# Patient Record
Sex: Female | Born: 1955
Health system: Southern US, Community
[De-identification: ages and names within clinical notes are randomized; demographics above are authoritative.]

## PROBLEM LIST (undated history)

## (undated) DIAGNOSIS — B009 Herpesviral infection, unspecified: Secondary | ICD-10-CM

## (undated) DIAGNOSIS — M1990 Unspecified osteoarthritis, unspecified site: Secondary | ICD-10-CM

## (undated) DIAGNOSIS — K219 Gastro-esophageal reflux disease without esophagitis: Secondary | ICD-10-CM

## (undated) DIAGNOSIS — M8580 Other specified disorders of bone density and structure, unspecified site: Secondary | ICD-10-CM

## (undated) DIAGNOSIS — K8020 Calculus of gallbladder without cholecystitis without obstruction: Secondary | ICD-10-CM

## (undated) DIAGNOSIS — I10 Essential (primary) hypertension: Secondary | ICD-10-CM

## (undated) DIAGNOSIS — A6000 Herpesviral infection of urogenital system, unspecified: Secondary | ICD-10-CM

## (undated) DIAGNOSIS — N840 Polyp of corpus uteri: Secondary | ICD-10-CM

## (undated) DIAGNOSIS — IMO0002 Reserved for concepts with insufficient information to code with codable children: Secondary | ICD-10-CM

## (undated) DIAGNOSIS — R945 Abnormal results of liver function studies: Secondary | ICD-10-CM

## (undated) DIAGNOSIS — R7989 Other specified abnormal findings of blood chemistry: Secondary | ICD-10-CM

## (undated) DIAGNOSIS — I341 Nonrheumatic mitral (valve) prolapse: Secondary | ICD-10-CM

## (undated) DIAGNOSIS — R87619 Unspecified abnormal cytological findings in specimens from cervix uteri: Secondary | ICD-10-CM

## (undated) HISTORY — DX: Other specified disorders of bone density and structure, unspecified site: M85.80

## (undated) HISTORY — DX: Unspecified osteoarthritis, unspecified site: M19.90

## (undated) HISTORY — DX: Gastro-esophageal reflux disease without esophagitis: K21.9

## (undated) HISTORY — DX: Abnormal results of liver function studies: R94.5

## (undated) HISTORY — DX: Herpesviral infection of urogenital system, unspecified: A60.00

## (undated) HISTORY — DX: Calculus of gallbladder without cholecystitis without obstruction: K80.20

## (undated) HISTORY — PX: DILATION AND CURETTAGE OF UTERUS: SHX78

## (undated) HISTORY — PX: NASAL SEPTUM SURGERY: SHX37

## (undated) HISTORY — PX: TONSILLECTOMY: SUR1361

## (undated) HISTORY — DX: Polyp of corpus uteri: N84.0

## (undated) HISTORY — DX: Unspecified abnormal cytological findings in specimens from cervix uteri: R87.619

## (undated) HISTORY — PX: HYSTEROSCOPY: SHX211

## (undated) HISTORY — DX: Nonrheumatic mitral (valve) prolapse: I34.1

## (undated) HISTORY — PX: COLPOSCOPY: SHX161

## (undated) HISTORY — DX: Reserved for concepts with insufficient information to code with codable children: IMO0002

## (undated) HISTORY — DX: Herpesviral infection, unspecified: B00.9

## (undated) HISTORY — PX: SHOULDER SURGERY: SHX246

## (undated) HISTORY — DX: Other specified abnormal findings of blood chemistry: R79.89

## (undated) HISTORY — PX: CERVICAL FUSION: SHX112

---
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
## 1999-04-13 ENCOUNTER — Other Ambulatory Visit: Admission: RE | Admit: 1999-04-13 | Discharge: 1999-04-13 | Payer: Self-pay | Admitting: Obstetrics and Gynecology

## 1999-07-31 ENCOUNTER — Emergency Department (HOSPITAL_COMMUNITY): Admission: EM | Admit: 1999-07-31 | Discharge: 1999-07-31 | Payer: Self-pay | Admitting: Emergency Medicine

## 2000-04-25 ENCOUNTER — Other Ambulatory Visit: Admission: RE | Admit: 2000-04-25 | Discharge: 2000-04-25 | Payer: Self-pay | Admitting: Obstetrics and Gynecology

## 2000-05-13 ENCOUNTER — Emergency Department (HOSPITAL_COMMUNITY): Admission: EM | Admit: 2000-05-13 | Discharge: 2000-05-13 | Payer: Self-pay | Admitting: Emergency Medicine

## 2000-05-13 ENCOUNTER — Encounter: Payer: Self-pay | Admitting: Emergency Medicine

## 2001-04-18 ENCOUNTER — Encounter: Admission: RE | Admit: 2001-04-18 | Discharge: 2001-04-18 | Payer: Self-pay | Admitting: Obstetrics and Gynecology

## 2001-04-18 ENCOUNTER — Encounter: Payer: Self-pay | Admitting: Obstetrics and Gynecology

## 2001-04-26 ENCOUNTER — Other Ambulatory Visit: Admission: RE | Admit: 2001-04-26 | Discharge: 2001-04-26 | Payer: Self-pay | Admitting: Obstetrics and Gynecology

## 2002-04-30 ENCOUNTER — Other Ambulatory Visit: Admission: RE | Admit: 2002-04-30 | Discharge: 2002-04-30 | Payer: Self-pay | Admitting: Obstetrics and Gynecology

## 2002-05-27 ENCOUNTER — Encounter: Payer: Self-pay | Admitting: Obstetrics and Gynecology

## 2002-05-27 ENCOUNTER — Encounter: Admission: RE | Admit: 2002-05-27 | Discharge: 2002-05-27 | Payer: Self-pay | Admitting: Obstetrics and Gynecology

## 2002-08-26 ENCOUNTER — Other Ambulatory Visit: Admission: RE | Admit: 2002-08-26 | Discharge: 2002-08-26 | Payer: Self-pay | Admitting: Obstetrics and Gynecology

## 2003-04-09 ENCOUNTER — Ambulatory Visit (HOSPITAL_BASED_OUTPATIENT_CLINIC_OR_DEPARTMENT_OTHER): Admission: RE | Admit: 2003-04-09 | Discharge: 2003-04-09 | Payer: Self-pay | Admitting: Obstetrics and Gynecology

## 2003-04-09 ENCOUNTER — Encounter (INDEPENDENT_AMBULATORY_CARE_PROVIDER_SITE_OTHER): Payer: Self-pay | Admitting: Specialist

## 2003-05-20 ENCOUNTER — Other Ambulatory Visit: Admission: RE | Admit: 2003-05-20 | Discharge: 2003-05-20 | Payer: Self-pay | Admitting: Obstetrics and Gynecology

## 2003-06-03 ENCOUNTER — Encounter: Payer: Self-pay | Admitting: Obstetrics and Gynecology

## 2003-06-03 ENCOUNTER — Encounter: Admission: RE | Admit: 2003-06-03 | Discharge: 2003-06-03 | Payer: Self-pay | Admitting: Obstetrics and Gynecology

## 2003-10-03 HISTORY — PX: UTERINE FIBROID SURGERY: SHX826

## 2003-12-28 ENCOUNTER — Other Ambulatory Visit: Admission: RE | Admit: 2003-12-28 | Discharge: 2003-12-28 | Payer: Self-pay | Admitting: Obstetrics and Gynecology

## 2004-05-25 ENCOUNTER — Other Ambulatory Visit: Admission: RE | Admit: 2004-05-25 | Discharge: 2004-05-25 | Payer: Self-pay | Admitting: Obstetrics and Gynecology

## 2004-07-06 ENCOUNTER — Encounter: Admission: RE | Admit: 2004-07-06 | Discharge: 2004-07-06 | Payer: Self-pay | Admitting: Obstetrics and Gynecology

## 2004-07-12 ENCOUNTER — Encounter: Admission: RE | Admit: 2004-07-12 | Discharge: 2004-07-12 | Payer: Self-pay | Admitting: Obstetrics and Gynecology

## 2005-05-26 ENCOUNTER — Other Ambulatory Visit: Admission: RE | Admit: 2005-05-26 | Discharge: 2005-05-26 | Payer: Self-pay | Admitting: Addiction Medicine

## 2005-09-01 ENCOUNTER — Encounter: Admission: RE | Admit: 2005-09-01 | Discharge: 2005-09-01 | Payer: Self-pay | Admitting: Obstetrics and Gynecology

## 2006-06-11 ENCOUNTER — Other Ambulatory Visit: Admission: RE | Admit: 2006-06-11 | Discharge: 2006-06-11 | Payer: Self-pay | Admitting: Obstetrics and Gynecology

## 2006-08-14 ENCOUNTER — Ambulatory Visit: Payer: Self-pay | Admitting: Internal Medicine

## 2006-08-28 ENCOUNTER — Ambulatory Visit: Payer: Self-pay | Admitting: Internal Medicine

## 2006-09-06 ENCOUNTER — Encounter: Admission: RE | Admit: 2006-09-06 | Discharge: 2006-09-06 | Payer: Self-pay | Admitting: Obstetrics and Gynecology

## 2006-09-18 ENCOUNTER — Encounter: Admission: RE | Admit: 2006-09-18 | Discharge: 2006-09-18 | Payer: Self-pay | Admitting: Obstetrics and Gynecology

## 2007-06-19 ENCOUNTER — Other Ambulatory Visit: Admission: RE | Admit: 2007-06-19 | Discharge: 2007-06-19 | Payer: Self-pay | Admitting: Obstetrics and Gynecology

## 2007-10-23 ENCOUNTER — Encounter: Admission: RE | Admit: 2007-10-23 | Discharge: 2007-10-23 | Payer: Self-pay | Admitting: Obstetrics and Gynecology

## 2008-06-19 ENCOUNTER — Encounter: Payer: Self-pay | Admitting: Obstetrics and Gynecology

## 2008-06-19 ENCOUNTER — Other Ambulatory Visit: Admission: RE | Admit: 2008-06-19 | Discharge: 2008-06-19 | Payer: Self-pay | Admitting: Obstetrics and Gynecology

## 2008-06-19 ENCOUNTER — Ambulatory Visit: Payer: Self-pay | Admitting: Obstetrics and Gynecology

## 2008-10-23 ENCOUNTER — Encounter: Admission: RE | Admit: 2008-10-23 | Discharge: 2008-10-23 | Payer: Self-pay | Admitting: Obstetrics and Gynecology

## 2009-03-05 ENCOUNTER — Encounter: Admission: RE | Admit: 2009-03-05 | Discharge: 2009-03-05 | Payer: Self-pay | Admitting: Chiropractic Medicine

## 2009-04-26 ENCOUNTER — Ambulatory Visit (HOSPITAL_COMMUNITY): Admission: RE | Admit: 2009-04-26 | Discharge: 2009-04-27 | Payer: Self-pay | Admitting: Neurosurgery

## 2009-06-21 ENCOUNTER — Encounter: Payer: Self-pay | Admitting: Obstetrics and Gynecology

## 2009-06-21 ENCOUNTER — Ambulatory Visit: Payer: Self-pay | Admitting: Obstetrics and Gynecology

## 2009-06-21 ENCOUNTER — Other Ambulatory Visit: Admission: RE | Admit: 2009-06-21 | Discharge: 2009-06-21 | Payer: Self-pay | Admitting: Obstetrics and Gynecology

## 2009-07-25 ENCOUNTER — Encounter: Admission: RE | Admit: 2009-07-25 | Discharge: 2009-07-25 | Payer: Self-pay | Admitting: Neurosurgery

## 2009-10-25 ENCOUNTER — Encounter: Admission: RE | Admit: 2009-10-25 | Discharge: 2009-10-25 | Payer: Self-pay | Admitting: Obstetrics and Gynecology

## 2009-12-27 ENCOUNTER — Ambulatory Visit: Payer: Self-pay | Admitting: Obstetrics and Gynecology

## 2010-03-08 ENCOUNTER — Ambulatory Visit: Payer: Self-pay | Admitting: Obstetrics and Gynecology

## 2010-06-22 ENCOUNTER — Ambulatory Visit: Payer: Self-pay | Admitting: Obstetrics and Gynecology

## 2010-06-22 ENCOUNTER — Other Ambulatory Visit: Admission: RE | Admit: 2010-06-22 | Discharge: 2010-06-22 | Payer: Self-pay | Admitting: Obstetrics and Gynecology

## 2010-09-12 ENCOUNTER — Ambulatory Visit: Payer: Self-pay | Admitting: Obstetrics and Gynecology

## 2010-09-29 ENCOUNTER — Ambulatory Visit: Payer: Self-pay | Admitting: Obstetrics and Gynecology

## 2010-10-22 ENCOUNTER — Other Ambulatory Visit: Payer: Self-pay | Admitting: Obstetrics and Gynecology

## 2010-10-22 DIAGNOSIS — Z1239 Encounter for other screening for malignant neoplasm of breast: Secondary | ICD-10-CM

## 2010-11-21 ENCOUNTER — Ambulatory Visit: Payer: Self-pay

## 2010-11-22 ENCOUNTER — Ambulatory Visit
Admission: RE | Admit: 2010-11-22 | Discharge: 2010-11-22 | Disposition: A | Discharge status: Home / Self Care | Payer: 59 | Source: Ambulatory Visit | Attending: Obstetrics and Gynecology | Admitting: Obstetrics and Gynecology

## 2010-11-22 DIAGNOSIS — Z1239 Encounter for other screening for malignant neoplasm of breast: Secondary | ICD-10-CM

## 2011-01-08 LAB — CBC
HCT: 41 % (ref 36.0–46.0)
Hemoglobin: 14.1 g/dL (ref 12.0–15.0)
MCHC: 34.5 g/dL (ref 30.0–36.0)
RBC: 4.46 MIL/uL (ref 3.87–5.11)

## 2011-01-08 LAB — BASIC METABOLIC PANEL
CO2: 27 mEq/L (ref 19–32)
Calcium: 9.5 mg/dL (ref 8.4–10.5)
GFR calc Af Amer: >60 mL/min (ref >60)
GFR calc non Af Amer: >60 mL/min (ref >60)
Potassium: 4.1 mEq/L (ref 3.5–5.1)
Sodium: 138 mEq/L (ref 135–145)

## 2011-01-08 LAB — APTT: aPTT: 25 seconds (ref 24–37)

## 2011-01-08 LAB — URINALYSIS, ROUTINE W REFLEX MICROSCOPIC
Nitrite: NEGATIVE
Specific Gravity, Urine: 1.015 (ref 1.005–1.030)
Urobilinogen, UA: 0.2 mg/dL (ref 0.0–1.0)
pH: 6 (ref 5.0–8.0)

## 2011-01-08 LAB — PROTIME-INR: INR: 0.9 (ref 0.00–1.49)

## 2011-02-14 NOTE — Op Note (Signed)
NAME:  Emily Brandt, Emily Brandt              ACCOUNT NO.:  [id number]   MEDICAL RECORD NO.:  [id number]          PATIENT TYPE:  OIB   LOCATION:  3526                         FACILITY:  [facility]   PHYSICIAN:  Clydene Fake, M.D.  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   PROCEDURES:  Anterior cervical decompression, diskectomy and fusion at  C5-6 and 6-7 with LifeNet allograft bone, Trestle anterior cervical  plate.   SURGEON:  Clydene Fake, MD   ASSISTANT:  None.   ANESTHESIA:  General endotracheal tube anesthesia.   ESTIMATED BLOOD LOSS:  Minimal.   BLOOD GIVEN:  None.   DRAINS:  None.   COMPLICATIONS:  None.   INDICATIONS FOR PROCEDURE:  The patient is a 55 year old woman with neck  and right arm pain and numbness and treated with conservative management  and physical therapy has not improved, she started to actually have some  increased weakness in her right arm and steroids and anti-inflammatory  medicines.  The right triceps shows 4+/5 strength and 5-/5 strength in  right finger extension.  An MRI showed spondylitic changes at 5-6 and 6-  7 with some disk herniation with right greater than left foraminal  narrowing at both levels and some loss of CSF around the cord at 5-6,  but no cord change.  The patient brought in for decompression and fusion  of these 2 levels.   PROCEDURE IN DETAIL:  The patient was brought into the operating room  and general anesthesia was induced.  The patient was placed in a 10-  pound halter traction, prepped and draped in the sterile fashion.  Site  of incision injected with 10 mL of 1% lidocaine with epinephrine.  Incision was then  made from the midline to the anterior border of the  sternocleidomastoid muscle on the left side.  The neck incision was  taken down to the platysma and hemostasis was obtained with Bovie  cauterization.  The platysma muscle was incised with a Bovie and blunt  dissection taken through the anterior cervical fascia at the anterior  cervical spine.  Needle was placed in the interspace.  X-ray was  obtained showing the needle was at the C5-6 interspace.  Disk space was  incised with a #15 blade and partial diskectomy performed with pituitary  rongeurs.  Longus coli muscles were reflected laterally from C5 through  7 and then self-retaining retractors were placed and we could see the 5-  6 and 6-7 interspaces.  Disk spaces were again incised with a #15 blade.  Diskectomy continued with pituitary rongeurs and curettes and Kerrison  punches were used to remove the anterior osteophytes.  Distraction pins  placed in the C5 and C7 and the interspaces were distracted.  Microscope  was brought in for microdissection.  At this point in time, diskectomy  continued  starting at 6-7 level with the curettes and 1- and 2-mm  Kerrison punches removing posterior disk osteophytes and ligament  decompressing the central canal, and performing bilateral foraminotomies  off to the right way out laterally free fragments of disk and these were  removed decompressing the nerve root.  We used a high-speed drill to  remove cartilaginous endplate, measured the height of disk space to be 4  mm, and after we got good hemostasis with Gelfoam and thrombin, we  irrigated out, so we tapped a 4-mm LifeNet allograft bone into place.  At 5-6 level, diskectomy continued with pituitary rongeurs and curette,  1- and 2-mm Kerrison punches were used to remove posterior disk  osteophyte and ligament decompressing the central canal, performing  bilateral foraminotomies.  Once we were finished, we had good central  and lateral  decompression and the disk space measured to be 4 mm.  Another 4-mm LifeNet allograft bone was tapped into place.  We checked  posterior to the grafts with a nerve hook.  There was a plenty of room  between the bone graft and the dura.  At both levels, we irrigated with  antibiotic solution.  Distraction pins were removed and hemostasis was  obtained with Gelfoam and thrombin.  Weight was removed from the  traction.  The bone was firmly in place, and the Trestle anterior  cervical plate was placed over the anterior cervical spine.  Two screws  were placed in the C5, two in the C6, two in the C7, these were  tightened down.  Lateral x-rays were obtained showing good  position of  plate, screws, and bone plugs at the 5-6 and 6-7 level.  Retractors were  removed.  We had good hemostasis.  We irrigated with antibiotic  solution.  The platysma was closed with 3-0 Vicryl interrupted suture,  subcutaneous tissue closed with the same, skin closed with benzoin and  Steri-Strips.  Dressing was placed.  The patient was placed in a soft  cervical collar, awakened from anesthesia, and transferred to recovery  room in stable condition.           ______________________________  Clydene Fake, M.D.     JRH/MEDQ  D:  04/26/2009  T:  04/26/2009  Job:  [id number]

## 2011-02-17 NOTE — Op Note (Signed)
NAME:  Emily Brandt, Emily Brandt                        ACCOUNT NO.:  [id number]   MEDICAL RECORD NO.:  [id number]                   PATIENT TYPE:  AMB   LOCATION:  [facility]                                 FACILITY:  [facility]   PHYSICIAN:  Daniel L. Eda Paschal, M.D.           DATE OF BIRTH:  Apr 04, 1956   DATE OF PROCEDURE:  04/09/2003  DATE OF DISCHARGE:                                 OPERATIVE REPORT   PREOPERATIVE DIAGNOSES:  1. Dysfunctional uterine bleeding.  2. Endometrial polyp.   POSTOPERATIVE DIAGNOSES:  1. Dysfunctional uterine bleeding.  2. Endometrial polyp.   OPERATION:  1. Hysteroscopy.  2. Endometrial sampling.  3. Excision of endometrial polyp.   SURGEON:  Daniel L. Eda Paschal, M.D.   ANESTHESIA:  MAC plus paracervical block.   INDICATIONS:  The patient is a 55 year old female who presented to the  office with a six-month history of bleeding between her periods.  Sonohysterogram was done and findings consisted of an endometrial polyp at  the top of the fundus.  As a result of this, she now enters the hospital for  hysteroscopy, D&C, and excision of the above.   FINDINGS:  External is within normal limits.  BUS is within normal limits.  Vaginal exam is normal.  Cervix is clean.  Uterus is normal size and shape  with first degree uterine descensus.  Adnexa are palpably normal.  At the  time of hysteroscopy the patient had a 1-2 cm endometrial polyp at the top  of the fundus.  Other than that, tubal ostia, anterior and posterior walls  of the fundus, lower uterine segment, and endocervical canal were free of  any disease.   PROCEDURE:  The patient was taken to the operating room and placed in the  dorsal lithotomy position and given paracervical block and MAC anesthesia.  Anesthesia was excellent.  Her cervix was dilated to a #31 Pratt dilator and  then a hysteroscopic examination was done with a hysteroscopic resectoscope.  Sorbitol 3% was used to expand the intrauterine  cavity and a camera was used  for magnification.  The polyp described above was seen.  Using a 9 degree  wire loop with a setting of 70 coag, 110 cutting, blend 1, the polyp was  completely resected.  This was done in little pieces.  Endometrial  curettings were also obtained.  All was sent to pathology for tissue  diagnosis.  At the  termination of the procedure there was no bleeding noted.  Estimated blood  loss was less than 20 mL with none replaced.  Fluid deficit was 25 mL.  The  patient tolerated the procedure well and left the operating room in  satisfactory condition.  Pictures were taken for documentation.  Daniel L. Eda Paschal, M.D.    Tonette Bihari  D:  04/09/2003  T:  04/09/2003  Job:  [id number]

## 2011-06-27 ENCOUNTER — Encounter: Payer: Self-pay | Admitting: Gynecology

## 2011-06-27 DIAGNOSIS — IMO0002 Reserved for concepts with insufficient information to code with codable children: Secondary | ICD-10-CM | POA: Insufficient documentation

## 2011-06-27 DIAGNOSIS — I341 Nonrheumatic mitral (valve) prolapse: Secondary | ICD-10-CM | POA: Insufficient documentation

## 2011-06-27 DIAGNOSIS — N840 Polyp of corpus uteri: Secondary | ICD-10-CM | POA: Insufficient documentation

## 2011-06-27 DIAGNOSIS — A6000 Herpesviral infection of urogenital system, unspecified: Secondary | ICD-10-CM | POA: Insufficient documentation

## 2011-07-06 ENCOUNTER — Encounter: Payer: Self-pay | Admitting: Obstetrics and Gynecology

## 2011-07-06 ENCOUNTER — Ambulatory Visit (INDEPENDENT_AMBULATORY_CARE_PROVIDER_SITE_OTHER): Payer: 59 | Admitting: Obstetrics and Gynecology

## 2011-07-06 ENCOUNTER — Other Ambulatory Visit (HOSPITAL_COMMUNITY)
Admission: RE | Admit: 2011-07-06 | Discharge: 2011-07-06 | Disposition: A | Discharge status: Home / Self Care | Payer: 59 | Source: Ambulatory Visit | Attending: Obstetrics and Gynecology | Admitting: Obstetrics and Gynecology

## 2011-07-06 VITALS — BP 120/80 | Ht 65.0 in | Wt 154.0 lb

## 2011-07-06 DIAGNOSIS — N950 Postmenopausal bleeding: Secondary | ICD-10-CM

## 2011-07-06 DIAGNOSIS — B009 Herpesviral infection, unspecified: Secondary | ICD-10-CM

## 2011-07-06 DIAGNOSIS — Z13220 Encounter for screening for lipoid disorders: Secondary | ICD-10-CM

## 2011-07-06 DIAGNOSIS — A609 Anogenital herpesviral infection, unspecified: Secondary | ICD-10-CM

## 2011-07-06 DIAGNOSIS — Z780 Asymptomatic menopausal state: Secondary | ICD-10-CM

## 2011-07-06 DIAGNOSIS — Z01419 Encounter for gynecological examination (general) (routine) without abnormal findings: Secondary | ICD-10-CM | POA: Insufficient documentation

## 2011-07-06 DIAGNOSIS — Z131 Encounter for screening for diabetes mellitus: Secondary | ICD-10-CM

## 2011-07-06 DIAGNOSIS — N951 Menopausal and female climacteric states: Secondary | ICD-10-CM

## 2011-07-06 MED ORDER — ACYCLOVIR 400 MG PO TABS: ORAL_TABLET | ORAL | Status: DC

## 2011-07-06 NOTE — Progress Notes (Signed)
Patient came to see me today for her annual GYN exam. She is up-to-date on her mammograms and bone densities. Since we declared her menopausal last year with an elevated FSH she's had 2 episodes of bleeding. They were both preceded by premenstrual symptoms. They lasted one or 2 days. The last one was in the last month. She also noticed diminished hot flashes surrounding these events. She is on Neurontin for back problems and I suspect it does help her hot flashes. She's not having vaginal dryness. She is having a lot of joint pain which she wonders is related to menopause.  Physical examination: HEENT within normal limits. Neck: Thyroid not large. No masses. Supraclavicular nodes: not enlarged. Breasts: Examined in both sitting midline position. No skin changes and no masses. Abdomen: Soft no guarding rebound or masses or hernia. Pelvic: External: Within normal limits. BUS: Within normal limits. Vaginal:within normal limits. Good estrogen effect. No evidence of cystocele rectocele or enterocele. Cervix: clean. Uterus: Normal size and shape. Adnexa: No masses. Rectovaginal exam: Confirmatory and negative. Extremities: Within normal limits.  Assessment #1 menopausal symptoms #2 postmenopausal bleeding  Plan: Endometrial biopsy done. We will call her with results. Very little tissue obtained. Discussed HRT for joint pain. Patient to decide and will inform. She understands she may have withdrawal bleeding.

## 2011-07-07 MED ORDER — MEDROXYPROGESTERONE ACETATE 5 MG PO TABS: ORAL_TABLET | ORAL | Status: DC

## 2011-07-07 MED ORDER — ESTRADIOL 0.5 MG PO TABS: 0.5000 mg | ORAL_TABLET | Freq: Every day | ORAL | Status: DC

## 2011-07-07 NOTE — Progress Notes (Signed)
Addended byValeda Malm L on: 07/07/2011 12:15 PM   Modules accepted: Orders

## 2011-10-23 ENCOUNTER — Encounter: Payer: Self-pay | Admitting: *Deleted

## 2011-10-23 ENCOUNTER — Other Ambulatory Visit: Payer: Self-pay | Admitting: Obstetrics and Gynecology

## 2011-10-23 DIAGNOSIS — Z1231 Encounter for screening mammogram for malignant neoplasm of breast: Secondary | ICD-10-CM

## 2011-10-23 NOTE — Progress Notes (Signed)
Patient ID: Emily Brandt, female   DOB: [birth year], 56 y.o.   MRN: [id number] Pt called wanting to know if she would have a period every month while on provera 5mg  and estrace 0.5 mg. Pt said that 1 month and period and next month no period. Pt informed this is normal.

## 2011-11-24 ENCOUNTER — Ambulatory Visit
Admission: RE | Admit: 2011-11-24 | Discharge: 2011-11-24 | Disposition: A | Discharge status: Home / Self Care | Payer: 59 | Source: Ambulatory Visit | Attending: Obstetrics and Gynecology | Admitting: Obstetrics and Gynecology

## 2011-11-24 DIAGNOSIS — Z1231 Encounter for screening mammogram for malignant neoplasm of breast: Secondary | ICD-10-CM

## 2012-07-11 ENCOUNTER — Emergency Department (HOSPITAL_BASED_OUTPATIENT_CLINIC_OR_DEPARTMENT_OTHER)
Admission: EM | Admit: 2012-07-11 | Discharge: 2012-07-11 | Disposition: A | Discharge status: Home / Self Care | Payer: 59 | Attending: Emergency Medicine | Admitting: Emergency Medicine

## 2012-07-11 ENCOUNTER — Emergency Department (HOSPITAL_BASED_OUTPATIENT_CLINIC_OR_DEPARTMENT_OTHER): Payer: 59

## 2012-07-11 ENCOUNTER — Encounter (HOSPITAL_BASED_OUTPATIENT_CLINIC_OR_DEPARTMENT_OTHER): Payer: Self-pay | Admitting: Emergency Medicine

## 2012-07-11 DIAGNOSIS — Y93B1 Activity, exercise machines primarily for muscle strengthening: Secondary | ICD-10-CM | POA: Insufficient documentation

## 2012-07-11 DIAGNOSIS — X503XXA Overexertion from repetitive movements, initial encounter: Secondary | ICD-10-CM | POA: Insufficient documentation

## 2012-07-11 DIAGNOSIS — K3189 Other diseases of stomach and duodenum: Secondary | ICD-10-CM | POA: Insufficient documentation

## 2012-07-11 DIAGNOSIS — M546 Pain in thoracic spine: Secondary | ICD-10-CM | POA: Insufficient documentation

## 2012-07-11 DIAGNOSIS — Z79899 Other long term (current) drug therapy: Secondary | ICD-10-CM | POA: Insufficient documentation

## 2012-07-11 DIAGNOSIS — S239XXA Sprain of unspecified parts of thorax, initial encounter: Secondary | ICD-10-CM | POA: Insufficient documentation

## 2012-07-11 DIAGNOSIS — T148XXA Other injury of unspecified body region, initial encounter: Secondary | ICD-10-CM

## 2012-07-11 LAB — BASIC METABOLIC PANEL
BUN: 14 mg/dL (ref 6–23)
Calcium: 9.4 mg/dL (ref 8.4–10.5)
Creatinine, Ser: 0.9 mg/dL (ref 0.50–1.10)
GFR calc Af Amer: 81 mL/min — ABNORMAL LOW (ref >90)

## 2012-07-11 LAB — D-DIMER, QUANTITATIVE: D-Dimer, Quant: <0.27 ug/mL-FEU (ref 0.00–0.48)

## 2012-07-11 LAB — CBC WITH DIFFERENTIAL/PLATELET
Basophils Absolute: 0 10*3/uL (ref 0.0–0.1)
Basophils Relative: 0 % (ref 0–1)
Eosinophils Absolute: 0.1 10*3/uL (ref 0.0–0.7)
Eosinophils Relative: 1 % (ref 0–5)
Lymphocytes Relative: 8 % — ABNORMAL LOW (ref 12–46)
Lymphs Abs: 0.8 10*3/uL (ref 0.7–4.0)
Monocytes Absolute: 0.5 10*3/uL (ref 0.1–1.0)
Neutro Abs: 8.9 10*3/uL — ABNORMAL HIGH (ref 1.7–7.7)
Neutrophils Relative %: 86 % — ABNORMAL HIGH (ref 43–77)
Platelets: 217 10*3/uL (ref 150–400)
RBC: 4.19 MIL/uL (ref 3.87–5.11)
WBC: 10.4 10*3/uL (ref 4.0–10.5)

## 2012-07-11 MED ORDER — METHOCARBAMOL 750 MG PO TABS: 750.0000 mg | ORAL_TABLET | Freq: Two times a day (BID) | ORAL | Status: DC

## 2012-07-11 MED ORDER — KETOROLAC TROMETHAMINE 30 MG/ML IJ SOLN
30.0000 mg | Freq: Once | INTRAMUSCULAR | Status: AC
  Administered 2012-07-11: 30 mg via INTRAVENOUS
  Filled 2012-07-11: qty 1

## 2012-07-11 MED ORDER — OXYCODONE-ACETAMINOPHEN 5-325 MG PO TABS: 1.0000 | ORAL_TABLET | Freq: Four times a day (QID) | ORAL | Status: DC | PRN

## 2012-07-11 MED ORDER — ASPIRIN 81 MG PO CHEW
324.0000 mg | CHEWABLE_TABLET | Freq: Once | ORAL | Status: AC
  Administered 2012-07-11: 324 mg via ORAL
  Filled 2012-07-11: qty 4

## 2012-07-11 MED ORDER — GI COCKTAIL ~~LOC~~
30.0000 mL | Freq: Once | ORAL | Status: AC
  Administered 2012-07-11: 30 mL via ORAL
  Filled 2012-07-11: qty 30

## 2012-07-11 MED ORDER — OXYCODONE-ACETAMINOPHEN 5-325 MG PO TABS
1.0000 | ORAL_TABLET | Freq: Once | ORAL | Status: AC
  Administered 2012-07-11: 1 via ORAL
  Filled 2012-07-11 (×2): qty 1

## 2012-07-11 NOTE — ED Notes (Signed)
Pt reports pain has moved back to center of her lower back, but it has eased off. Pt resting in room in no acute distress, family member at bedside.

## 2012-07-11 NOTE — ED Notes (Signed)
Pt report taking flexeril around 0200 with no relief. Pt also reports "heartburn" and has taken mylanta.

## 2012-07-11 NOTE — ED Provider Notes (Signed)
History     CSN: [id number]  Arrival date & time 07/11/12  8756   First MD Initiated Contact with Patient 07/11/12 [phone]      Chief Complaint  Patient presents with  . Back Pain    (Consider location/radiation/quality/duration/timing/severity/associated sxs/prior treatment) Patient is a 56 y.o. female presenting with back pain. The history is provided by the patient. No language interpreter was used.  Back Pain  This is a new problem. The current episode started 3 to 5 hours ago. The problem occurs constantly. The problem has not changed since onset.The pain is associated with lifting heavy objects (restarted nautilus machines after 2 months 25-50 lbs). The pain is present in the thoracic spine. The quality of the pain is described as stabbing. The pain does not radiate. The pain is severe. The pain is the same all the time. Pertinent negatives include no chest pain, no fever, no numbness, no weight loss, no headaches, no abdominal pain, no abdominal swelling, no bowel incontinence, no perianal numbness, no bladder incontinence, no dysuria, no pelvic pain, no leg pain, no paresthesias, no paresis, no tingling and no weakness. Treatments tried: muscle relaxants. The treatment provided no relief. Risk factors include menopause.  No chest pain.  Has indigestion but states this always happened with pot roast with onion gravy.  No DOE no SOB no n/v/d.  Past Medical History  Diagnosis Date  . Abnormal finding on Pap smear     Cervicitis  . Endometrial polyp   . Herpes progenitalis   . MVP (mitral valve prolapse)     antibiotic for dental procedures    Past Surgical History  Procedure Date  . Hysteroscopy   . Dilation and curettage of uterus   . Nasal septum surgery   . Tonsillectomy   . Cervical fusion   . Shoulder surgery     Family History  Problem Relation Age of Onset  . Colon cancer Mother   . Hypertension Mother   . Osteoporosis Mother   . Heart disease Sister   .  Diabetes Maternal Grandmother   . Heart disease Maternal Grandfather     History  Substance Use Topics  . Smoking status: Never Smoker   . Smokeless tobacco: Not on file  . Alcohol Use: Yes     occas    OB History    Grav Para Term Preterm Abortions TAB SAB Ect Mult Living   1    1  1          Review of Systems  Constitutional: Negative for fever and weight loss.  Respiratory: Negative for chest tightness.   Cardiovascular: Negative for chest pain.  Gastrointestinal: Negative for abdominal pain and bowel incontinence.  Genitourinary: Negative for bladder incontinence, dysuria and pelvic pain.  Musculoskeletal: Positive for back pain.  Neurological: Negative for tingling, weakness, numbness, headaches and paresthesias.  All other systems reviewed and are negative.    Allergies  Review of patient's allergies indicates no known allergies.  Home Medications   Current Outpatient Rx  Name Route Sig Dispense Refill  . ACYCLOVIR 400 MG PO TABS Oral Take 400 mg by mouth daily.     . ACYCLOVIR 400 MG PO TABS  One daily 30 tablet 12  . OMEGA-3 FATTY ACIDS 1000 MG PO CAPS Oral Take 1 g by mouth daily.      Marland Kitchen GABAPENTIN PO Oral Take by mouth.      . MEDROXYPROGESTERONE ACETATE 5 MG PO TABS  Take daily on days 1-12  12 tablet 12  . ONE-DAILY MULTI VITAMINS PO TABS Oral Take 1 tablet by mouth daily.      Marland Kitchen NORTRIPTYLINE HCL PO Oral Take by mouth.      Marland Kitchen CALCIUM + D PO Oral Take by mouth.      Marland Kitchen VITAMIN D PO Oral Take by mouth.      . ESTRADIOL 0.5 MG PO TABS Oral Take 1 tablet (0.5 mg total) by mouth daily. 30 tablet 12    BP 153/81  Pulse 93  Temp 97.2 F (36.2 C) (Oral)  Resp 18  Ht 5\' 5"  (1.651 m)  Wt 152 lb (68.947 kg)  BMI 25.29 kg/m2  SpO2 100%  Physical Exam  Constitutional: She is oriented to person, place, and time. She appears well-developed and well-nourished. No distress.  HENT:  Head: Normocephalic and atraumatic.  Mouth/Throat: Oropharynx is clear and  moist.  Eyes: Conjunctivae normal are normal. Pupils are equal, round, and reactive to light.  Neck: Normal range of motion. Neck supple.  Cardiovascular: Normal rate, regular rhythm and intact distal pulses.   Pulmonary/Chest: Effort normal and breath sounds normal. She has no wheezes. She has no rales.  Abdominal: Soft. Bowel sounds are normal. There is no tenderness. There is no rebound and no guarding.  Musculoskeletal: Normal range of motion. She exhibits no edema.       Arms: Neurological: She is alert and oriented to person, place, and time.  Skin: Skin is warm and dry.  Psychiatric: She has a normal mood and affect.    ED Course  Procedures (including critical care time)   Labs Reviewed  CBC WITH DIFFERENTIAL  BASIC METABOLIC PANEL  D-DIMER, QUANTITATIVE  TROPONIN I   No results found.   No diagnosis found.    MDM   Date: 07/11/2012  Rate: 97  Rhythm: normal sinus rhythm  QRS Axis: normal  Intervals: normal  ST/T Wave abnormalities: normal  Conduction Disutrbances: none  Narrative Interpretation: unremarkable    Pain is reproducible on back exam.  Pain is musculoskeletal in nature and in the setting of a negative EKG and 2 negative troponins ACS is excluded.  No nautilus machines for several days, gentle stretchin will prescribe pain meds and muscle relaxant.  Return for chest pain shortness of breath or any concerns        Teal Bontrager K Shemica Meath-Rasch, MD 07/11/12 [phone]

## 2012-07-11 NOTE — ED Notes (Signed)
Pt c/o mid back pain. Pt denies known injury but states she did work out yesterday. Pt also hx of cervical fusion.

## 2012-07-15 ENCOUNTER — Encounter: Payer: Self-pay | Admitting: Obstetrics and Gynecology

## 2012-07-15 ENCOUNTER — Ambulatory Visit (INDEPENDENT_AMBULATORY_CARE_PROVIDER_SITE_OTHER): Payer: 59 | Admitting: Obstetrics and Gynecology

## 2012-07-15 VITALS — BP 124/80 | Ht 65.5 in | Wt 154.0 lb

## 2012-07-15 DIAGNOSIS — Z01419 Encounter for gynecological examination (general) (routine) without abnormal findings: Secondary | ICD-10-CM

## 2012-07-15 DIAGNOSIS — A609 Anogenital herpesviral infection, unspecified: Secondary | ICD-10-CM

## 2012-07-15 DIAGNOSIS — B009 Herpesviral infection, unspecified: Secondary | ICD-10-CM

## 2012-07-15 DIAGNOSIS — Z23 Encounter for immunization: Secondary | ICD-10-CM

## 2012-07-15 LAB — URINALYSIS W MICROSCOPIC + REFLEX CULTURE
Crystals: NONE SEEN
Glucose, UA: NEGATIVE mg/dL
Leukocytes, UA: NEGATIVE
Protein, ur: 30 mg/dL — AB
Specific Gravity, Urine: >1.03 — ABNORMAL HIGH (ref 1.005–1.030)
pH: 5 (ref 5.0–8.0)

## 2012-07-15 LAB — LIPID PANEL
LDL Cholesterol: 80 mg/dL (ref 0–99)
Triglycerides: 77 mg/dL (ref <150)
VLDL: 15 mg/dL (ref 0–40)

## 2012-07-15 MED ORDER — ACYCLOVIR 400 MG PO TABS: ORAL_TABLET | ORAL | Status: DC

## 2012-07-15 NOTE — Progress Notes (Signed)
Patient came to see me today for her annual GYN exam. She remains on HRT but she thinks she's can stop this month due to lack of symptoms. She does not have withdrawal bleeding. She is up-to-date on mammograms. She had an abnormal Pap smear in 2003 consistent with low-grade cervical dysplasia. Colposcopy with biopsy failed to confirm it. Since then she's had normal Pap smears. She had a normal Pap smear 2012. She is noticing intermittent suprapubic discomfort which feels like premenstrual cramps.this has been going on for a while-between 1 and 2 years. She went to the emergency room the other night because of upper back pain. She also notices weekend right upper quadrant pain. She is having no vaginal bleeding. She had a normal bone density 2 years ago. Her blood sugar was elevated in the emergency room the other night. She takes Zovirax daily to prevent herpetic outbreaks. This worked very well.  Physical examination:Emily Brandt present. HEENT within normal limits. Neck: Thyroid not large. No masses. Supraclavicular nodes: not enlarged. Breasts: Examined in both sitting and lying  position. No skin changes and no masses. Abdomen: Soft no guarding rebound or masses or hernia. Pelvic: External: Within normal limits. BUS: Within normal limits. Vaginal:within normal limits. Good estrogen effect. No evidence of cystocele rectocele or enterocele. Cervix: clean. Uterus: Normal size and shape. Adnexa: No masses. Rectovaginal exam: Confirmatory and negative. Extremities: Within normal limits.  Assessment: #1. Herpes Pro Genitalis #2. Abnormal Pap smear in 2003 3. Back pain #4. Menstrual cramping.  Plan: Appointment Dr. Foy [place]. Continue Zovirax. Discontinue HRT. Pelvic ultrasound  If CT not done as part of her workup for back pain. Continue yearly mammograms. Pap not done.The new Pap smear guidelines were discussed with the patient.

## 2012-07-15 NOTE — Patient Instructions (Signed)
See Dr. Foy [place] about your  back. Continue yearly mammograms. Return for pelvic ultrasound if  CT scan not done.

## 2012-07-15 NOTE — Addendum Note (Signed)
Addended by: Dayna Barker on: 07/15/2012 09:43 AM   Modules accepted: Orders

## 2012-07-19 ENCOUNTER — Encounter: Payer: Self-pay | Admitting: Obstetrics and Gynecology

## 2012-08-03 ENCOUNTER — Other Ambulatory Visit: Payer: Self-pay | Admitting: Obstetrics and Gynecology

## 2012-08-16 ENCOUNTER — Telehealth: Payer: Self-pay | Admitting: *Deleted

## 2012-08-16 DIAGNOSIS — R109 Unspecified abdominal pain: Secondary | ICD-10-CM

## 2012-08-16 NOTE — Telephone Encounter (Signed)
Pt called to follow up OV 07/15/12 regarding abdominal pain. Pt had abdominal u/s done and it showed gallstones. Per office note not CT scan. Pt will schedule ultrasound here at office.

## 2012-08-21 ENCOUNTER — Encounter (INDEPENDENT_AMBULATORY_CARE_PROVIDER_SITE_OTHER): Payer: Self-pay

## 2012-08-22 ENCOUNTER — Ambulatory Visit (INDEPENDENT_AMBULATORY_CARE_PROVIDER_SITE_OTHER): Payer: 59 | Admitting: General Surgery

## 2012-08-22 ENCOUNTER — Encounter (INDEPENDENT_AMBULATORY_CARE_PROVIDER_SITE_OTHER): Payer: Self-pay | Admitting: General Surgery

## 2012-08-22 VITALS — BP 134/82 | HR 74 | Temp 98.6°F | Resp 16 | Ht 65.5 in | Wt 151.6 lb

## 2012-08-22 DIAGNOSIS — K8020 Calculus of gallbladder without cholecystitis without obstruction: Secondary | ICD-10-CM

## 2012-08-22 NOTE — Patient Instructions (Signed)

## 2012-08-22 NOTE — Progress Notes (Signed)
Patient ID: Emily Brandt, female   DOB: 1956/03/31, 56 y.o.   MRN: [id number]  Chief Complaint  Patient presents with  . Abdominal Pain    Evaluate gallstones    HPI ESTHA FEW is a 56 y.o. female.   HPI 56 yo WF referred by Gillermina Hu, NP, for evaluation of gallstones. The patient states that she initially developed severe neck back pain on October 10. The pain lasted for over an hour prompting her to go to the emergency room. She was diagnosed with musculoskeletal strain and given some muscle relaxants and sent home. She had a few additional episodes of back pain. She followed up with her PCPs office who ordered an abdominal ultrasound and she was found to have gallstones. She states that she has had ongoing intermittent back pain radiating to her upper abdomen. She had an episode 3 days and around on November 10 through the 13th. Most of these episodes occurred several hours after eating food. It may occur in the late evening. The pain would last for several hours. The most recent episode was associated with severe nausea as well as an episode of emesis. She also felt some bloating in her upper abdomen. She also complained of severe indigestion during these episodes. She denies any NSAID use. She denies any current trouble swallowing liquids or solids. She denies any constipation. She did have some loose stool last week. She denies any melena or hematochezia. She denies any weight loss. She states that she did run a low-grade temperature during those 3 days when she had pain. Her niece had a hydrocodone tablet which she took which helped decrease the pain. The episodes of pain occurred after she had eaten some spaghetti, peppers and onions, and a grilled cheese Past Medical History  Diagnosis Date  . Abnormal finding on Pap smear     Cervicitis  . Endometrial polyp   . Herpes progenitalis   . MVP (mitral valve prolapse)     antibiotic for dental procedures  . Gallstones   . GERD  (gastroesophageal reflux disease)   . Arthritis     Past Surgical History  Procedure Date  . Hysteroscopy   . Dilation and curettage of uterus   . Nasal septum surgery   . Tonsillectomy   . Cervical fusion   . Shoulder surgery   . Colposcopy   . Nasal septum surgery 1974  . Uterine fibroid surgery 2005  . Tonsillectomy 1965    Family History  Problem Relation Age of Onset  . Adopted: Yes  . Colon cancer Mother   . Hypertension Mother   . Osteoporosis Mother   . Cancer Mother   . Heart disease Sister   . Diabetes Maternal Grandmother   . Heart disease Maternal Grandfather     Social History History  Substance Use Topics  . Smoking status: Never Smoker   . Smokeless tobacco: Not on file  . Alcohol Use: 0.0 oz/week    1-2 Glasses of wine per week     Comment: occas    No Known Allergies  Current Outpatient Prescriptions  Medication Sig Dispense Refill  . acyclovir (ZOVIRAX) 400 MG tablet One daily  30 tablet  12  . fish oil-omega-3 fatty acids 1000 MG capsule Take 1 g by mouth daily.        Marland Kitchen GABAPENTIN PO Take by mouth.        . loratadine-pseudoephedrine (CLARITIN-D 12-HOUR) 5-120 MG per tablet Take 1 tablet by mouth 2 (  two) times daily.      . methocarbamol (ROBAXIN) 750 MG tablet Take 1 tablet (750 mg total) by mouth 2 (two) times daily.  20 tablet  0  . Multiple Vitamin (MULTIVITAMIN) tablet Take 1 tablet by mouth daily.        Marland Kitchen NORTRIPTYLINE HCL PO Take by mouth.        . estradiol (ESTRACE) 0.5 MG tablet Take 1 tablet (0.5 mg total) by mouth daily.  30 tablet  12  . [DISCONTINUED] estradiol (ESTRACE) 0.5 MG tablet Take 0.5 mg by mouth daily.        Review of Systems Review of Systems  Constitutional: Negative for fever, activity change, appetite change and unexpected weight change.  HENT: Positive for neck pain. Negative for hearing loss and neck stiffness.   Eyes: Negative for photophobia and visual disturbance.  Respiratory: Negative for chest  tightness and shortness of breath.   Cardiovascular: Negative for chest pain, palpitations and leg swelling.       Denies CP, SOB< DOE, PND, orthopnea  Gastrointestinal:       See HPI  Genitourinary: Negative for dysuria, hematuria and difficulty urinating.  Musculoskeletal: Negative for myalgias and joint swelling.  Skin: Negative for rash and wound.  Neurological: Positive for numbness (in fingertips from neck; takes gabapentin). Negative for dizziness, tremors, seizures, facial asymmetry, speech difficulty and headaches.  Hematological: Negative for adenopathy. Does not bruise/bleed easily.  Psychiatric/Behavioral: Negative for behavioral problems.    Blood pressure 134/82, pulse 74, temperature 98.6 F (37 C), temperature source Temporal, resp. rate 16, height 5' 5.5" (1.664 m), weight 151 lb 9.6 oz (68.765 kg).  Physical Exam Physical Exam  Vitals reviewed. Constitutional: She is oriented to person, place, and time. She appears well-developed and well-nourished. No distress.  HENT:  Head: Normocephalic and atraumatic.  Right Ear: External ear normal.  Left Ear: External ear normal.  Eyes: Conjunctivae normal are normal. No scleral icterus.  Neck: Neck supple. No tracheal deviation present. No thyromegaly present.  Cardiovascular: Normal rate, regular rhythm, normal heart sounds and intact distal pulses.   Pulmonary/Chest: Effort normal and breath sounds normal. No respiratory distress. She has no wheezes.  Abdominal: Soft. Bowel sounds are normal. She exhibits no distension. There is no tenderness. There is no rebound and no guarding.  Musculoskeletal: She exhibits no edema and no tenderness.  Neurological: She is alert and oriented to person, place, and time. She exhibits normal muscle tone.  Skin: Skin is warm and dry. She is not diaphoretic.  Psychiatric: She has a normal mood and affect. Her behavior is normal. Judgment and thought content normal.    Data Reviewed GYN  notes abd u/s 10/23 - multiple GS, no wall thickening. Largest stone 2.2cm. cbd ok.   Assessment    Symptomatic cholelithiasis    Plan    I believe the patient's symptoms are consistent with gallbladder disease.  We discussed gallbladder disease. The patient was given Agricultural engineer. We discussed non-operative and operative management. We discussed the signs & symptoms of acute cholecystitis  I discussed laparoscopic cholecystectomy with IOC in detail.  The patient was given educational material as well as diagrams detailing the procedure.  We discussed the risks and benefits of a laparoscopic cholecystectomy including, but not limited to bleeding, infection, injury to surrounding structures such as the intestine or liver, bile leak, retained gallstones, need to convert to an open procedure, prolonged diarrhea, blood clots such as  DVT, common bile duct injury, anesthesia risks,  and possible need for additional procedures.  We discussed the typical post-operative recovery course. I explained that the likelihood of improvement of their symptoms is good.  She has decided to proceed with surgery.  Mary Sella. Andrey Campanile, MD, FACS General, Bariatric, & Minimally Invasive Surgery [facility], [place]        [facility] M 08/22/2012, 2:54 PM

## 2012-08-26 ENCOUNTER — Ambulatory Visit (INDEPENDENT_AMBULATORY_CARE_PROVIDER_SITE_OTHER): Payer: 59 | Admitting: Obstetrics and Gynecology

## 2012-08-26 ENCOUNTER — Ambulatory Visit (INDEPENDENT_AMBULATORY_CARE_PROVIDER_SITE_OTHER): Payer: 59

## 2012-08-26 ENCOUNTER — Encounter (INDEPENDENT_AMBULATORY_CARE_PROVIDER_SITE_OTHER): Payer: Self-pay

## 2012-08-26 ENCOUNTER — Telehealth (INDEPENDENT_AMBULATORY_CARE_PROVIDER_SITE_OTHER): Payer: Self-pay | Admitting: General Surgery

## 2012-08-26 DIAGNOSIS — N83339 Acquired atrophy of ovary and fallopian tube, unspecified side: Secondary | ICD-10-CM

## 2012-08-26 DIAGNOSIS — D219 Benign neoplasm of connective and other soft tissue, unspecified: Secondary | ICD-10-CM

## 2012-08-26 DIAGNOSIS — R102 Pelvic and perineal pain: Secondary | ICD-10-CM

## 2012-08-26 DIAGNOSIS — R109 Unspecified abdominal pain: Secondary | ICD-10-CM

## 2012-08-26 DIAGNOSIS — D259 Leiomyoma of uterus, unspecified: Secondary | ICD-10-CM

## 2012-08-26 DIAGNOSIS — N949 Unspecified condition associated with female genital organs and menstrual cycle: Secondary | ICD-10-CM

## 2012-08-26 DIAGNOSIS — N854 Malposition of uterus: Secondary | ICD-10-CM

## 2012-08-26 NOTE — Telephone Encounter (Signed)
Message copied by Liliana Cline on Mon Aug 26, 2012  3:43 PM ------      Message from: Cathi Roan      Created: Mon Aug 26, 2012  1:16 PM      Regarding: Surgery      Contact: [phone]       Please call she saw GYN this morning and has questions concerning up coming surgery next week

## 2012-08-26 NOTE — Telephone Encounter (Signed)
Called patient. She saw Dr Eda Paschal and he wanted Dr Andrey Campanile to look at her pelvis at the time of surgery. Dr Eda Paschal note in [facility]. I told patient I would make Dr Andrey Campanile aware of the request. Lap chole scheduled 09/03/2012.

## 2012-08-26 NOTE — Patient Instructions (Signed)
Ask Dr. Andrey Campanile to look at her pelvis during surgery and let me know.

## 2012-08-26 NOTE — Progress Notes (Signed)
Patient came back today for ultrasound because of suprapubic pain and right lower quadrant pain. After her last visit she saw her PCP and on ultrasound she has gallstones. She is scheduled for cholecystectomy in one week by Dr. Andrey Campanile. She also needs an evaluation for a lumbar disc. On ultrasound today her uterus shows 2 very small fibroids of 1 cm each. Her endometrial echo is thin at  1.7 mm. Her ovaries or are bilaterally atrophic without masses. Her cul-de-sac is free of fluid.  Assessment: Pelvic pain.  fibroids. Cholecystitis. Possible lumbar disc.  Plan: We discussed findings on ultrasound. We discussed pelvic adhesions. She will ask Dr. Andrey Campanile to look at her pelvis and let me know at the time of her surgery. She will get an evaluation for her disc.  We discussed urological consult if pain persists.

## 2012-09-03 ENCOUNTER — Other Ambulatory Visit (INDEPENDENT_AMBULATORY_CARE_PROVIDER_SITE_OTHER): Payer: Self-pay | Admitting: General Surgery

## 2012-09-03 DIAGNOSIS — K8010 Calculus of gallbladder with chronic cholecystitis without obstruction: Secondary | ICD-10-CM

## 2012-09-03 HISTORY — PX: LAPAROSCOPIC CHOLECYSTECTOMY: SUR755

## 2012-09-04 ENCOUNTER — Telehealth (INDEPENDENT_AMBULATORY_CARE_PROVIDER_SITE_OTHER): Payer: Self-pay | Admitting: General Surgery

## 2012-09-04 NOTE — Telephone Encounter (Signed)
Called to check on patient. She is doing well. Just having some post op pain. She will call with any problems and keep follow up appt.

## 2012-09-12 ENCOUNTER — Encounter (INDEPENDENT_AMBULATORY_CARE_PROVIDER_SITE_OTHER): Payer: Self-pay | Admitting: General Surgery

## 2012-09-12 ENCOUNTER — Ambulatory Visit (INDEPENDENT_AMBULATORY_CARE_PROVIDER_SITE_OTHER): Payer: 59 | Admitting: General Surgery

## 2012-09-12 ENCOUNTER — Telehealth (INDEPENDENT_AMBULATORY_CARE_PROVIDER_SITE_OTHER): Payer: Self-pay | Admitting: General Surgery

## 2012-09-12 VITALS — BP 122/78 | HR 76 | Temp 98.7°F | Resp 14 | Ht 65.5 in | Wt 150.0 lb

## 2012-09-12 DIAGNOSIS — Z09 Encounter for follow-up examination after completed treatment for conditions other than malignant neoplasm: Secondary | ICD-10-CM

## 2012-09-12 NOTE — Telephone Encounter (Signed)
[facility] Op note faxed to Dr Eda Paschal.

## 2012-09-12 NOTE — Progress Notes (Signed)
Subjective:     Patient ID: LORRANE MCCAY, female   DOB: [birth year], 56 y.o.   MRN: [id number]  HPI 56 year old Caucasian female comes in for followup after Laparoscopic cholecystectomy with intraoperative cholangiogram at the [facility] on 12/3. She had evidence of mild acute cholecystitis on chronic cholecystitis. She states that she is in well since surgery. She still has some occasional soreness at her umbilicus and right upper quadrant. She denies any fever, chills, nausea, vomiting, diarrhea or constipation. She denies any problems with her incisions. She did have an episode of indigestion last night after eating chicken kebab at dinner  Review of Systems     Objective:   Physical Exam BP 122/78  Pulse 76  Temp 98.7 F (37.1 C) (Temporal)  Resp 14  Ht 5' 5.5" (1.664 m)  Wt 150 lb (68.04 kg)  BMI 24.58 kg/m2  Gen: alert, NAD, non-toxic appearing Pupils: equal, no scleral icterus Abd: soft, nontender, nondistended. Well-healed trocar sites. No cellulitis. No incisional hernia Skin: no rash, no jaundice     Assessment:     S/p laparoscopic cholecystectomy with IOC    Plan:     We discussed her pathology report which showed chronic cholecystitis and cholelithiasis. I believe she is doing quite well. I have released her to full activities for this coming Monday. I told her to call the office should the intermittent soreness continue after 4 weeks. Otherwise followup when necessary  Mary Sella. Andrey Campanile, MD, FACS General, Bariatric, & Minimally Invasive Surgery [facility], [place]

## 2012-09-12 NOTE — Patient Instructions (Addendum)
Please call with any questions Can resume full activities on Monday

## 2012-10-21 ENCOUNTER — Other Ambulatory Visit: Payer: Self-pay | Admitting: Gynecology

## 2012-10-21 DIAGNOSIS — Z1231 Encounter for screening mammogram for malignant neoplasm of breast: Secondary | ICD-10-CM

## 2012-12-09 ENCOUNTER — Ambulatory Visit
Admission: RE | Admit: 2012-12-09 | Discharge: 2012-12-09 | Disposition: A | Discharge status: Home / Self Care | Payer: 59 | Source: Ambulatory Visit | Attending: Gynecology | Admitting: Gynecology

## 2013-07-21 ENCOUNTER — Ambulatory Visit (INDEPENDENT_AMBULATORY_CARE_PROVIDER_SITE_OTHER): Payer: 59 | Admitting: Gynecology

## 2013-07-21 ENCOUNTER — Encounter: Payer: Self-pay | Admitting: Gynecology

## 2013-07-21 ENCOUNTER — Telehealth: Payer: Self-pay | Admitting: Internal Medicine

## 2013-07-21 VITALS — BP 128/86 | Ht 65.0 in | Wt 160.0 lb

## 2013-07-21 DIAGNOSIS — Z01419 Encounter for gynecological examination (general) (routine) without abnormal findings: Secondary | ICD-10-CM

## 2013-07-21 DIAGNOSIS — B009 Herpesviral infection, unspecified: Secondary | ICD-10-CM

## 2013-07-21 DIAGNOSIS — Z1159 Encounter for screening for other viral diseases: Secondary | ICD-10-CM

## 2013-07-21 DIAGNOSIS — Z1211 Encounter for screening for malignant neoplasm of colon: Secondary | ICD-10-CM

## 2013-07-21 DIAGNOSIS — A609 Anogenital herpesviral infection, unspecified: Secondary | ICD-10-CM

## 2013-07-21 LAB — CBC WITH DIFFERENTIAL/PLATELET
Basophils Absolute: 0 10*3/uL (ref 0.0–0.1)
Eosinophils Absolute: 0.1 10*3/uL (ref 0.0–0.7)
Eosinophils Relative: 1 % (ref 0–5)
Lymphocytes Relative: 21 % (ref 12–46)
MCH: 30.9 pg (ref 26.0–34.0)
MCV: 89.9 fL (ref 78.0–100.0)
Platelets: 286 10*3/uL (ref 150–400)
RDW: 13.4 % (ref 11.5–15.5)
WBC: 6.9 10*3/uL (ref 4.0–10.5)

## 2013-07-21 MED ORDER — ACYCLOVIR 400 MG PO TABS: ORAL_TABLET | ORAL | Status: DC

## 2013-07-21 NOTE — Telephone Encounter (Signed)
Patient states she has found her biological mother and her mother had a history of colon cancer. She is asking if this will change her colonoscopy recall. Old chart ordered for MD to review.

## 2013-07-21 NOTE — Progress Notes (Signed)
Emily Brandt [birth year] [id number]   History:    58 y.o.  for annual gyn exam when no complaints today. Patient with vague vasomotor symptoms which she states are far and in between. In 2011 she would this cleared menopausal based on elevated FSH. Patient has been off of hormone replacement therapy for a year.She is on Neurontin for back problems and I suspect it does help her hot flashes. She's not having vaginal dryness. Patient denies any vaginal bleeding. Patient stated that her last colonoscopy was in 2007 which was normal. Since the last colonoscopy she was able to find out that her biological mother did have colon cancer history. Patient with no prior history of abnormal Pap smear. 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 Patient had flu vaccine last week. Last mammogram this year was normal. Last bone density study this year normal although done and it different facility and we do not have the report. She did have a normal bone density study here in our office in 2011.  She had an abnormal Pap smear in 2003 consistent with low-grade cervical dysplasia. Colposcopy with biopsy failed to confirm it. Since then she's had normal Pap smears. She had a normal Pap smear 2012.   Past medical history,surgical history, family history and social history were all reviewed and documented in the EPIC chart.  Gynecologic History No LMP recorded. Patient is postmenopausal. Contraception: post menopausal status Last Pap: 2012. Results were: normal Last mammogram: 2014. Results were: normal  Obstetric History OB History  Gravida Para Term Preterm AB SAB TAB Ectopic Multiple Living  1    1 1     0    # Outcome Date GA Lbr Len/2nd Weight Sex Delivery Anes PTL Lv  1 SAB                ROS: A ROS was performed and pertinent positives and negatives are included in the history.  GENERAL: No fevers or chills. HEENT: No change in vision, no earache, sore throat or sinus  congestion. NECK: No pain or stiffness. CARDIOVASCULAR: No chest pain or pressure. No palpitations. PULMONARY: No shortness of breath, cough or wheeze. GASTROINTESTINAL: No abdominal pain, nausea, vomiting or diarrhea, melena or bright red blood per rectum. GENITOURINARY: No urinary frequency, urgency, hesitancy or dysuria. MUSCULOSKELETAL: No joint or muscle pain, no back pain, no recent trauma. DERMATOLOGIC: No rash, no itching, no lesions. ENDOCRINE: No polyuria, polydipsia, no heat or cold intolerance. No recent change in weight. HEMATOLOGICAL: No anemia or easy bruising or bleeding. NEUROLOGIC: No headache, seizures, numbness, tingling or weakness. PSYCHIATRIC: No depression, no loss of interest in normal activity or change in sleep pattern.     Exam: chaperone present  BP 128/86  Ht 5\' 5"  (1.651 m)  Wt 160 lb (72.576 kg)  BMI 26.63 kg/m2  Body mass index is 26.63 kg/(m^2).  General appearance : Well developed well nourished female. No acute distress HEENT: Neck supple, trachea midline, no carotid bruits, no thyroidmegaly Lungs: Clear to auscultation, no rhonchi or wheezes, or rib retractions  Heart: Regular rate and rhythm, no murmurs or gallops Breast:Examined in sitting and supine position were symmetrical in appearance, no palpable masses or tenderness,  no skin retraction, no nipple inversion, no nipple discharge, no skin discoloration, no axillary or supraclavicular lymphadenopathy Abdomen: no palpable masses or tenderness, no rebound or guarding Extremities: no edema or skin discoloration or tenderness  Pelvic:  Bartholin, Urethra, Skene Glands: Within normal limits  Vagina: No gross lesions or discharge  Cervix: No gross lesions or discharge  Uterus  anteverted, normal size, shape and consistency, non-tender and mobile  Adnexa  Without masses or tenderness  Anus and perineum  normal   Rectovaginal  normal sphincter tone without palpated masses or tenderness              Hemoccult card provided     Assessment/Plan:  57 y.o. female for annual exam 1 year off HRT with minimal vasomotor symptoms. The following labs were work today: CBC, screen cholesterol, comprehensive metabolic panel, TSH, urinalysis. Pap smear not done today in accordance to the new guidelines.  New CDC guidelines is recommending patients be tested once in her lifetime for hepatitis C antibody who were born between [birth year] through 1965. This was discussed with the patient today and has agreed to be tested today.  Note: This dictation was prepared with  Dragon/digital dictation along withSmart phrase technology. Any transcriptional errors that result from this process are unintentional.   Ok Edwards MD, 2:55 PM 07/21/2013

## 2013-07-21 NOTE — Telephone Encounter (Signed)
Once we know the date of her last colonoscopy the, she ought to have the next one in 5 years.

## 2013-07-22 ENCOUNTER — Encounter: Payer: Self-pay | Admitting: Internal Medicine

## 2013-07-22 ENCOUNTER — Encounter: Payer: Self-pay | Admitting: Obstetrics and Gynecology

## 2013-07-22 LAB — COMPREHENSIVE METABOLIC PANEL
ALT: 32 U/L (ref 0–35)
AST: 24 U/L (ref 0–37)
Creat: 0.86 mg/dL (ref 0.50–1.10)
Total Bilirubin: 0.5 mg/dL (ref 0.3–1.2)

## 2013-07-22 LAB — URINALYSIS W MICROSCOPIC + REFLEX CULTURE
Bilirubin Urine: NEGATIVE
Crystals: NONE SEEN
Glucose, UA: NEGATIVE mg/dL
Specific Gravity, Urine: 1.005 (ref 1.005–1.030)
Squamous Epithelial / LPF: NONE SEEN
Urobilinogen, UA: 0.2 mg/dL (ref 0.0–1.0)

## 2013-07-22 NOTE — Telephone Encounter (Signed)
Last colonoscopy on 08/28/2006. Left a message for patient to call me.

## 2013-07-22 NOTE — Telephone Encounter (Signed)
Spoke with patient and told her she is due for colonoscopy per guidelines based on hx of colon ca in her mother. Scheduled on 08/19/13 at 3:30 PM for colonoscopy and pre visit on 08/14/13 at 3:30 PM.

## 2013-08-07 ENCOUNTER — Other Ambulatory Visit: Payer: Self-pay

## 2013-08-07 ENCOUNTER — Telehealth: Payer: Self-pay

## 2013-08-07 NOTE — Telephone Encounter (Signed)
Patient sent back her patient questionnaire. The last question asks if you have a question for your physican. She did. See below.  "Question: is there anything else you would like to ask your physician? Answer: I am scheduled for a colonoscopy with Dr. Juanda Chance on 08/19/13; therefore, is it necessary to mail in the FIT sample for testing"  Pls advise.

## 2013-08-07 NOTE — Telephone Encounter (Signed)
Patient informed of this by email via My Chart.

## 2013-08-07 NOTE — Telephone Encounter (Signed)
No since she will be getting a colonoscopy

## 2013-08-14 ENCOUNTER — Ambulatory Visit (AMBULATORY_SURGERY_CENTER): Payer: Self-pay | Admitting: *Deleted

## 2013-08-14 VITALS — Ht 65.0 in | Wt 162.4 lb

## 2013-08-14 DIAGNOSIS — Z1211 Encounter for screening for malignant neoplasm of colon: Secondary | ICD-10-CM

## 2013-08-14 DIAGNOSIS — Z800 Family history of malignant neoplasm of digestive organs: Secondary | ICD-10-CM

## 2013-08-14 MED ORDER — MOVIPREP 100 G PO SOLR: 1.0000 | Freq: Once | ORAL | Status: DC

## 2013-08-14 NOTE — Progress Notes (Signed)
Denies allergies to eggs or soy products. Denies complications with anesthesia or sedation. 

## 2013-08-18 ENCOUNTER — Encounter: Payer: Self-pay | Admitting: Internal Medicine

## 2013-08-19 ENCOUNTER — Ambulatory Visit (AMBULATORY_SURGERY_CENTER): Payer: 59 | Admitting: Internal Medicine

## 2013-08-19 ENCOUNTER — Encounter: Payer: Self-pay | Admitting: Internal Medicine

## 2013-08-19 VITALS — BP 135/77 | HR 86 | Temp 97.1°F | Resp 23 | Ht 65.0 in | Wt 162.0 lb

## 2013-08-19 DIAGNOSIS — Z800 Family history of malignant neoplasm of digestive organs: Secondary | ICD-10-CM

## 2013-08-19 DIAGNOSIS — Z1211 Encounter for screening for malignant neoplasm of colon: Secondary | ICD-10-CM

## 2013-08-19 MED ORDER — SODIUM CHLORIDE 0.9 % IV SOLN: 500.0000 mL | INTRAVENOUS | Status: DC

## 2013-08-19 NOTE — Op Note (Signed)
[facility] [address] [place], [address]   COLONOSCOPY PROCEDURE REPORT  PATIENT: Emily Brandt, Emily Brandt  MR#: [id number] BIRTHDATE: 1956/05/22 , 57  yrs. old GENDER: Female ENDOSCOPIST: Hart Carwin, MD REFERRED XB:[id number] Foy [place], M.D. , Dr J.Fernandez PROCEDURE DATE:  08/19/2013 PROCEDURE:   Colonoscopy, screening First Screening Colonoscopy - Avg.  risk and is 50 yrs.  old or older - No.  Prior Negative Screening - Now for repeat screening. 10 or more years since last screening Prior Negative Screening - Now for repeat screening.  Above average risk  History of Adenoma - Now for follow-up colonoscopy & has been > or = to 3 yrs.  N/A Polyps Removed Today? No.  Recommend repeat exam, <10 yrs? Yes. High risk (family or personal hx). ASA CLASS:   Class II INDICATIONS:mother with colon cancer. Last colonoscopy in November 2007 was normal MEDICATIONS: MAC sedation, administered by CRNA,, and propofol (Diprivan) 200mg  IV  DESCRIPTION OF PROCEDURE:   After the risks benefits and alternatives of the procedure were thoroughly explained, informed consent was obtained.  A digital rectal exam revealed no abnormalities of the rectum.   The LB [id number]  endoscope was introduced through the anus and advanced to the cecum, which was identified by both the appendix and ileocecal valve. No adverse events experienced.   The quality of the prep was Prepopik good The instrument was then slowly withdrawn as the colon was fully examined.      COLON FINDINGS: A normal appearing cecum, ileocecal valve, and appendiceal orifice were identified.  The ascending, hepatic flexure, transverse, splenic flexure, descending, sigmoid colon and rectum appeared unremarkable.  No polyps or cancers were seen. Retroflexed views revealed no abnormalities. The time to cecum=4 minutes 5 seconds.  Withdrawal time=7 minutes 51 seconds.  The scope was withdrawn and the procedure  completed. COMPLICATIONS: There were no complications.  ENDOSCOPIC IMPRESSION: Normal colon  RECOMMENDATIONS: 1.  High fiber diet 2.   recall colonoscopy in 5 years   eSigned:  Hart Carwin, MD 08/19/2013 4:28 PM   cc:

## 2013-08-19 NOTE — Progress Notes (Signed)
Patient did not experience any of the following events: a burn prior to discharge; a fall within the facility; wrong site/side/patient/procedure/implant event; or a hospital transfer or hospital admission upon discharge from the facility. (G8907) Patient did not have preoperative order for IV antibiotic SSI prophylaxis. (G8918)  

## 2013-08-19 NOTE — Patient Instructions (Signed)
YOU HAD AN ENDOSCOPIC PROCEDURE TODAY AT THE [facility]: Refer to the procedure report that was given to you for any specific questions about what was found during the examination.  If the procedure report does not answer your questions, please call your gastroenterologist to clarify.  If you requested that your care partner not be given the details of your procedure findings, then the procedure report has been included in a sealed envelope for you to review at your convenience later.  YOU SHOULD EXPECT: Some feelings of bloating in the abdomen. Passage of more gas than usual.  Walking can help get rid of the air that was put into your GI tract during the procedure and reduce the bloating. If you had a lower endoscopy (such as a colonoscopy or flexible sigmoidoscopy) you may notice spotting of blood in your stool or on the toilet paper. If you underwent a bowel prep for your procedure, then you may not have a normal bowel movement for a few days.  DIET: Your first meal following the procedure should be a light meal and then it is ok to progress to your normal diet.  A half-sandwich or bowl of soup is an example of a good first meal.  Heavy or fried foods are harder to digest and may make you feel nauseous or bloated.  Likewise meals heavy in dairy and vegetables can cause extra gas to form and this can also increase the bloating.  Drink plenty of fluids but you should avoid alcoholic beverages for 24 hours.  ACTIVITY: Your care partner should take you home directly after the procedure.  You should plan to take it easy, moving slowly for the rest of the day.  You can resume normal activity the day after the procedure however you should NOT DRIVE or use heavy machinery for 24 hours (because of the sedation medicines used during the test).    SYMPTOMS TO REPORT IMMEDIATELY: A gastroenterologist can be reached at any hour.  During normal business hours, 8:30 AM to 5:00 PM Monday through Friday,  call [phone].  After hours and on weekends, please call the GI answering service at [phone] who will take a message and have the physician on call contact you.   Following lower endoscopy (colonoscopy or flexible sigmoidoscopy):  Excessive amounts of blood in the stool  Significant tenderness or worsening of abdominal pains  Swelling of the abdomen that is new, acute  Fever of 100F or higher    FOLLOW UP: If any biopsies were taken you will be contacted by phone or by letter within the next 1-3 weeks.  Call your gastroenterologist if you have not heard about the biopsies in 3 weeks.  Our staff will call the home number listed on your records the next business day following your procedure to check on you and address any questions or concerns that you may have at that time regarding the information given to you following your procedure. This is a courtesy call and so if there is no answer at the home number and we have not heard from you through the emergency physician on call, we will assume that you have returned to your regular daily activities without incident.  SIGNATURES/CONFIDENTIALITY: You and/or your care partner have signed paperwork which will be entered into your electronic medical record.  These signatures attest to the fact that that the information above on your After Visit Summary has been reviewed and is understood.  Full responsibility of the confidentiality   of this discharge information lies with you and/or your care-partner.  Normal colonoscopy, follow-up colonoscopy 5 years.  High fiber diet information given.

## 2013-08-20 ENCOUNTER — Telehealth: Payer: Self-pay | Admitting: *Deleted

## 2013-08-20 NOTE — Telephone Encounter (Signed)
  Follow up Call-  Call back number 08/19/2013  Post procedure Call Back phone  # [phone]  Permission to leave phone message Yes     Patient questions:  Do you have a fever, pain , or abdominal swelling? no Pain Score  0 *  Have you tolerated food without any problems? yes  Have you been able to return to your normal activities? yes  Do you have any questions about your discharge instructions: Diet   no Medications  no Follow up visit  no  Do you have questions or concerns about your Care? no  Actions: * If pain score is 4 or above: No action needed, pain <4.

## 2013-09-23 ENCOUNTER — Ambulatory Visit: Payer: 59

## 2013-09-23 ENCOUNTER — Ambulatory Visit (INDEPENDENT_AMBULATORY_CARE_PROVIDER_SITE_OTHER): Payer: 59 | Admitting: Family Medicine

## 2013-09-23 VITALS — BP 122/80 | HR 90 | Temp 98.7°F | Resp 16 | Ht 65.0 in | Wt 158.0 lb

## 2013-09-23 DIAGNOSIS — R05 Cough: Secondary | ICD-10-CM

## 2013-09-23 LAB — POCT CBC
Hemoglobin: 14.8 g/dL (ref 12.2–16.2)
MCH, POC: 30.5 pg (ref 27–31.2)
MCHC: 31.6 g/dL — AB (ref 31.8–35.4)
MCV: 96.4 fL (ref 80–97)
MID (cbc): 0.5 (ref 0–0.9)
MPV: 9.9 fL (ref 0–99.8)
POC LYMPH PERCENT: 23.8 %L (ref 10–50)
POC MID %: 6.9 %M (ref 0–12)
Platelet Count, POC: 304 10*3/uL (ref 142–424)
RBC: 4.86 M/uL (ref 4.04–5.48)
RDW, POC: 13.4 %
WBC: 7.9 10*3/uL (ref 4.6–10.2)

## 2013-09-23 MED ORDER — DOXYCYCLINE HYCLATE 100 MG PO CAPS: 100.0000 mg | ORAL_CAPSULE | Freq: Two times a day (BID) | ORAL | Status: DC

## 2013-09-23 MED ORDER — BENZONATATE 100 MG PO CAPS: 100.0000 mg | ORAL_CAPSULE | Freq: Three times a day (TID) | ORAL | Status: DC | PRN

## 2013-09-23 NOTE — Progress Notes (Addendum)
[facility] [address] [place] [address] [phone]  Date:  09/23/2013   Name:  Emily Brandt   DOB:  January 02, 1956   MRN:  [id number]  PCP:  Lenora Boys, MD    Chief Complaint: chest congestion and Cough   History of Present Illness:  Emily Brandt is a 57 y.o. very pleasant female patient who presents with the following:  Here today as a new patient with illnes.  She started with a ST, then developed a cough and chest congestion. She has been ill since late November/ early December.  [facility]. She was treated with augmentin for one week- she finished this 3 days ago She has had a temp up to 99.4 at home off an on.   She notes both chest and sinus congestion.  The fever seemed to develop last week.    At this time she notes a fullness in her sinuses. Her back does hurt from cough but no other body aches  She has a plate in her neck from a cervical fusion- she is not sure if coughing might loosen her plate.  She is taking nortriptyline and gabapentin since her surgery She is taking mucinex DM.  No other medications right now  She is generally healthy.  Her husband was ill for a little while but he is now better.    No definite known exposure to pertussis   Patient Active Problem List   Diagnosis Date Noted  . Abnormal finding on Pap smear   . Herpes progenitalis   . MVP (mitral valve prolapse)     Past Medical History  Diagnosis Date  . Abnormal finding on Pap smear     Cervicitis  . Endometrial polyp   . Herpes progenitalis   . MVP (mitral valve prolapse)     antibiotic for dental procedures  . Gallstones   . GERD (gastroesophageal reflux disease)   . Arthritis     Past Surgical History  Procedure Laterality Date  . Hysteroscopy    . Dilation and curettage of uterus    . Nasal septum surgery    . Tonsillectomy    . Cervical fusion    . Shoulder surgery    . Colposcopy    .  Nasal septum surgery  1974  . Uterine fibroid surgery  2005  . Tonsillectomy  1965  . Laparoscopic cholecystectomy  09/03/2012    History  Substance Use Topics  . Smoking status: Never Smoker   . Smokeless tobacco: Never Used  . Alcohol Use: 0.0 oz/week    1-2 Glasses of wine per week     Comment: occas    Family History  Problem Relation Age of Onset  . Adopted: Yes  . Colon cancer Mother   . Hypertension Mother   . Osteoporosis Mother   . Cancer Mother   . Heart disease Sister   . Diabetes Maternal Grandmother   . Heart disease Maternal Grandfather   . Esophageal cancer Neg Hx   . Rectal cancer Neg Hx   . Stomach cancer Neg Hx     No Known Allergies  Medication list has been reviewed and updated.  Current Outpatient Prescriptions on File Prior to Visit  Medication Sig Dispense Refill  . acyclovir (ZOVIRAX) 400 MG tablet One daily  30 tablet  12  . fish oil-omega-3 fatty acids 1000 MG capsule Take 1 g by mouth daily.        Marland Kitchen  GABAPENTIN PO Take by mouth.        . loratadine-pseudoephedrine (CLARITIN-D 12-HOUR) 5-120 MG per tablet Take 1 tablet by mouth as needed.       . Multiple Minerals-Vitamins (CALCIUM & VIT D3 BONE HEALTH PO) Take 2 tablets by mouth daily.      . Multiple Vitamin (MULTIVITAMIN) tablet Take 1 tablet by mouth daily.        Marland Kitchen NORTRIPTYLINE HCL PO Take by mouth.        . Ubiquinol 200 MG CAPS Take 1 capsule by mouth daily.       No current facility-administered medications on file prior to visit.    Review of Systems:  As per HPI- otherwise negative.   Physical Examination: Filed Vitals:   09/23/13 1350  BP: 122/80  Pulse: 107  Temp: 98.7 F (37.1 C)  Resp: 16   Filed Vitals:   09/23/13 1350  Height: 5\' 5"  (1.651 m)  Weight: 158 lb (71.668 kg)   Body mass index is 26.29 kg/(m^2). Ideal Body Weight: Weight in (lb) to have BMI = 25: 149.9  GEN: WDWN, NAD, Non-toxic, A & O x 3, looks well HEENT: Atraumatic, Normocephalic. Neck  supple. No masses, No LAD.  Bilateral TM wnl, oropharynx normal.  PEERL,EOMI.  Nasal cavity is congested Ears and Nose: No external deformity. CV: RRR, No M/G/R. No JVD. No thrill. No extra heart sounds. PULM: CTA B, crackles, rhonchi. No retractions. No resp. distress. No accessory muscle use.  Minimal wheezes at first exam EXTR: No c/c/e NEURO Normal gait.  PSYCH: Normally interactive. Conversant. Not depressed or anxious appearing.  Calm demeanor.   [facility] reading (PRIMARY) by  Dr. Patsy Lager. CXR:  Negative  CHEST 2 VIEW  COMPARISON: 07/11/2012  FINDINGS: The heart size and mediastinal contours are within normal limits. Both lungs are clear. The visualized skeletal structures are unremarkable. Lower cervical fusion hardware noted. Overall stable exam.  IMPRESSION: No active cardiopulmonary disease.   Results for orders placed in visit on 09/23/13  POCT CBC      Result Value Range   WBC 7.9  4.6 - 10.2 K/uL   Lymph, poc 1.9  0.6 - 3.4   POC LYMPH PERCENT 23.8  10 - 50 %L   MID (cbc) 0.5  0 - 0.9   POC MID % 6.9  0 - 12 %M   POC Granulocyte 5.5  2 - 6.9   Granulocyte percent 69.3  37 - 80 %G   RBC 4.86  4.04 - 5.48 M/uL   Hemoglobin 14.8  12.2 - 16.2 g/dL   HCT, POC 16.1  09.6 - 47.9 %   MCV 96.4  80 - 97 fL   MCH, POC 30.5  27 - 31.2 pg   MCHC 31.6 (*) 31.8 - 35.4 g/dL   RDW, POC 04.5     Platelet Count, POC 304  142 - 424 K/uL   MPV 9.9  0 - 99.8 fL    Assessment and Plan: Cough - Plan: POCT CBC, DG Chest 2 View, doxycycline (VIBRAMYCIN) 100 MG capsule, benzonatate (TESSALON) 100 MG capsule, Bordetella pertussis antibody  Will treat for persistent cough with doxycycline and tessalon perles.  Also check pertussis titer.  Will plan further follow- up pending labs. See patient instructions for more details.     Signed Abbe Amsterdam, MD  1/13: received her pertussis titer and discussed with ID doctor on call.  It is possible although not certain that she has had  a  recent pertussis infection.  She does not feel ill, but does continue to cough.  Given her history of borderline QTc will avoid azithromycin in her and use septra instead- DS BID for 14 days.  She is in agreement with this plan

## 2013-09-23 NOTE — Patient Instructions (Signed)
We will check a pertussis (whooping cough) blood titer for you.  I will get in touch with the results Use the tessalon perles as needed, and start the doxycycline rx.  remember to pick up some probiotics at the drug store to help protect your good bacteria Let me know if you are feeling worse or not feeling better soon

## 2013-09-28 LAB — BORDETELLA PERTUSSIS ANTIBODY: B pertussis IgA Ab, Quant: 0.7 U/mL (ref <=1.1)

## 2013-10-01 LAB — BORDETELLA PERTUSSIS, IGG BY IB (RFLX)
B. pertussis IgG IBA PT100: POSITIVE
B. pertussis IgG IBA PT: POSITIVE

## 2013-10-01 LAB — BORDETELLA PERTUSSIS, IGM BY IB (RFLX): B pertussis, IgM IBA PT: NEGATIVE

## 2013-10-13 ENCOUNTER — Encounter: Payer: Self-pay | Admitting: Family Medicine

## 2013-10-14 MED ORDER — SULFAMETHOXAZOLE-TMP DS 800-160 MG PO TABS: 1.0000 | ORAL_TABLET | Freq: Two times a day (BID) | ORAL | Status: DC

## 2013-10-14 NOTE — Addendum Note (Signed)
Addended by: Lamar Blinks C on: 10/14/2013 08:33 PM   Modules accepted: Orders, Medications

## 2013-11-10 ENCOUNTER — Other Ambulatory Visit: Payer: Self-pay

## 2013-11-10 DIAGNOSIS — Z1231 Encounter for screening mammogram for malignant neoplasm of breast: Secondary | ICD-10-CM

## 2013-12-11 ENCOUNTER — Ambulatory Visit
Admission: RE | Admit: 2013-12-11 | Discharge: 2013-12-11 | Disposition: A | Discharge status: Home / Self Care | Payer: 59 | Source: Ambulatory Visit

## 2013-12-11 DIAGNOSIS — Z1231 Encounter for screening mammogram for malignant neoplasm of breast: Secondary | ICD-10-CM

## 2014-07-14 ENCOUNTER — Encounter: Payer: Self-pay | Admitting: Internal Medicine

## 2014-07-22 ENCOUNTER — Other Ambulatory Visit (HOSPITAL_COMMUNITY)
Admission: RE | Admit: 2014-07-22 | Discharge: 2014-07-22 | Disposition: A | Discharge status: Home / Self Care | Payer: 59 | Source: Ambulatory Visit | Attending: Gynecology | Admitting: Gynecology

## 2014-07-22 ENCOUNTER — Ambulatory Visit (INDEPENDENT_AMBULATORY_CARE_PROVIDER_SITE_OTHER): Payer: 59 | Admitting: Gynecology

## 2014-07-22 ENCOUNTER — Encounter: Payer: Self-pay | Admitting: Gynecology

## 2014-07-22 VITALS — BP 128/76 | Ht 65.25 in | Wt 160.0 lb

## 2014-07-22 DIAGNOSIS — Z1151 Encounter for screening for human papillomavirus (HPV): Secondary | ICD-10-CM | POA: Diagnosis present

## 2014-07-22 DIAGNOSIS — Z01419 Encounter for gynecological examination (general) (routine) without abnormal findings: Secondary | ICD-10-CM | POA: Diagnosis not present

## 2014-07-22 DIAGNOSIS — Z23 Encounter for immunization: Secondary | ICD-10-CM

## 2014-07-22 DIAGNOSIS — A609 Anogenital herpesviral infection, unspecified: Secondary | ICD-10-CM

## 2014-07-22 LAB — LIPID PANEL
Cholesterol: 160 mg/dL (ref 0–200)
HDL: 57 mg/dL (ref >39)
LDL Cholesterol: 81 mg/dL (ref 0–99)
Total CHOL/HDL Ratio: 2.8 ratio
Triglycerides: 110 mg/dL (ref <150)
VLDL: 22 mg/dL (ref 0–40)

## 2014-07-22 LAB — CBC WITH DIFFERENTIAL/PLATELET
Basophils Absolute: 0 K/uL (ref 0.0–0.1)
Basophils Relative: 0 % (ref 0–1)
Eosinophils Absolute: 0.2 K/uL (ref 0.0–0.7)
Eosinophils Relative: 3 % (ref 0–5)
HCT: 41.1 % (ref 36.0–46.0)
Hemoglobin: 13.7 g/dL (ref 12.0–15.0)
Lymphocytes Relative: 17 % (ref 12–46)
Lymphs Abs: 1.1 K/uL (ref 0.7–4.0)
MCH: 30.2 pg (ref 26.0–34.0)
MCHC: 33.3 g/dL (ref 30.0–36.0)
MCV: 90.5 fL (ref 78.0–100.0)
Monocytes Absolute: 0.4 K/uL (ref 0.1–1.0)
Monocytes Relative: 7 % (ref 3–12)
Neutro Abs: 4.6 K/uL (ref 1.7–7.7)
Neutrophils Relative %: 73 % (ref 43–77)
Platelets: 264 K/uL (ref 150–400)
RBC: 4.54 MIL/uL (ref 3.87–5.11)
RDW: 13.3 % (ref 11.5–15.5)
WBC: 6.3 K/uL (ref 4.0–10.5)

## 2014-07-22 LAB — COMPREHENSIVE METABOLIC PANEL WITH GFR
ALT: 43 U/L — ABNORMAL HIGH (ref 0–35)
AST: 35 U/L (ref 0–37)
Albumin: 4.6 g/dL (ref 3.5–5.2)
Alkaline Phosphatase: 76 U/L (ref 39–117)
BUN: 16 mg/dL (ref 6–23)
CO2: 26 meq/L (ref 19–32)
Calcium: 9.3 mg/dL (ref 8.4–10.5)
Chloride: 105 meq/L (ref 96–112)
Creat: 0.81 mg/dL (ref 0.50–1.10)
Glucose, Bld: 99 mg/dL (ref 70–99)
Potassium: 4.9 meq/L (ref 3.5–5.3)
Sodium: 142 meq/L (ref 135–145)
Total Bilirubin: 0.6 mg/dL (ref 0.2–1.2)
Total Protein: 6.7 g/dL (ref 6.0–8.3)

## 2014-07-22 LAB — URINALYSIS W MICROSCOPIC + REFLEX CULTURE
Bacteria, UA: NONE SEEN
Bilirubin Urine: NEGATIVE
CRYSTALS: NONE SEEN
Casts: NONE SEEN
Glucose, UA: NEGATIVE mg/dL
Hgb urine dipstick: NEGATIVE
Ketones, ur: NEGATIVE mg/dL
LEUKOCYTES UA: NEGATIVE
NITRITE: NEGATIVE
PROTEIN: NEGATIVE mg/dL
Specific Gravity, Urine: 1.026 (ref 1.005–1.030)
Squamous Epithelial / LPF: NONE SEEN
UROBILINOGEN UA: 0.2 mg/dL (ref 0.0–1.0)
pH: 5.5 (ref 5.0–8.0)

## 2014-07-22 LAB — TSH: TSH: 1.303 u[IU]/mL (ref 0.350–4.500)

## 2014-07-22 MED ORDER — ACYCLOVIR 400 MG PO TABS: ORAL_TABLET | ORAL | Status: DC

## 2014-07-22 NOTE — Progress Notes (Signed)
Emily Brandt [birth year] [id number]   History:    58 y.o.  for annual gyn exam with no complaints today. Patient on no hormone replacement therapy. Has no vasomotor or vaginal dryness complaints. She had been on HRT in the past and no longer on it..She is on Neurontin for back problems and I suspect it does help her hot flashes. Patient had found out that her biological mother had history of colon cancer. Patient had a normal colonoscopy in 2014.  She had an abnormal Pap smear in 2003 consistent with low-grade cervical dysplasia. Colposcopy with biopsy failed to confirm it. Since then she's had normal Pap smears. She had a normal Pap smear 2012.    patient had her flu vaccine recently. She has not had a Tdap vaccine. She takes Valtrex daily for HSV suppression. She reports having had a bone density study in 2014 at another facility and was reported to be normal. We have 1 study from our institution in 2011 which was normal. She is taking her calcium and vitamin D.  Past medical history,surgical history, family history and social history were all reviewed and documented in the EPIC chart.  Gynecologic History No LMP recorded. Patient is postmenopausal. Contraception: post menopausal status Last Pap: 2012. Results were: normal Last mammogram: 2015. Results were: normal  Obstetric History OB History  Gravida Para Term Preterm AB SAB TAB Ectopic Multiple Living  1    1 1     0    # Outcome Date GA Lbr Len/2nd Weight Sex Delivery Anes PTL Lv  1 SAB                ROS: A ROS was performed and pertinent positives and negatives are included in the history.  GENERAL: No fevers or chills. HEENT: No change in vision, no earache, sore throat or sinus congestion. NECK: No pain or stiffness. CARDIOVASCULAR: No chest pain or pressure. No palpitations. PULMONARY: No shortness of breath, cough or wheeze. GASTROINTESTINAL: No abdominal pain, nausea, vomiting or diarrhea, melena or bright red blood  per rectum. GENITOURINARY: No urinary frequency, urgency, hesitancy or dysuria. MUSCULOSKELETAL: No joint or muscle pain, no back pain, no recent trauma. DERMATOLOGIC: No rash, no itching, no lesions. ENDOCRINE: No polyuria, polydipsia, no heat or cold intolerance. No recent change in weight. HEMATOLOGICAL: No anemia or easy bruising or bleeding. NEUROLOGIC: No headache, seizures, numbness, tingling or weakness. PSYCHIATRIC: No depression, no loss of interest in normal activity or change in sleep pattern.     Exam: chaperone present  BP 128/76  Ht 5' 5.25" (1.657 m)  Wt 160 lb (72.576 kg)  BMI 26.43 kg/m2  Body mass index is 26.43 kg/(m^2).  General appearance : Well developed well nourished female. No acute distress HEENT: Neck supple, trachea midline, no carotid bruits, no thyroidmegaly Lungs: Clear to auscultation, no rhonchi or wheezes, or rib retractions  Heart: Regular rate and rhythm, no murmurs or gallops Breast:Examined in sitting and supine position were symmetrical in appearance, no palpable masses or tenderness,  no skin retraction, no nipple inversion, no nipple discharge, no skin discoloration, no axillary or supraclavicular lymphadenopathy Abdomen: no palpable masses or tenderness, no rebound or guarding Extremities: no edema or skin discoloration or tenderness  Pelvic:  Bartholin, Urethra, Skene Glands: Within normal limits             Vagina: No gross lesions or discharge, atrophic changes  Cervix: No gross lesions or discharge  Uterus  anteverted, normal size, shape and  consistency, non-tender and mobile  Adnexa  Without masses or tenderness  Anus and perineum  normal   Rectovaginal  normal sphincter tone without palpated masses or tenderness             Hemoccult cards provided     Assessment/Plan:  58 y.o. female for annual exam postmenopausal no longer on HRT doing well otherwise. Not sexually active. Vaginal atrophy was noted. Patient asymptomatic otherwise.  Patient received a Tdap vaccine today. Pap smear was done today. The following labs were ordered: CBC, fasting lipid profile, comprehensive metabolic panel, TSH and urinalysis. She was provided with fecal Hemoccult cards to submit to the office at a later date for testing. We discussed importance of monthly self breast exams. We discussed importance of calcium vitamin D and regular exercise for osteoporosis prevention.   Terrance Mass MD, 9:28 AM 07/22/2014

## 2014-07-22 NOTE — Patient Instructions (Signed)

## 2014-07-23 ENCOUNTER — Other Ambulatory Visit: Payer: Self-pay | Admitting: Gynecology

## 2014-07-23 DIAGNOSIS — R740 Nonspecific elevation of levels of transaminase and lactic acid dehydrogenase [LDH]: Principal | ICD-10-CM

## 2014-07-23 DIAGNOSIS — R7401 Elevation of levels of liver transaminase levels: Secondary | ICD-10-CM

## 2014-07-23 LAB — CYTOLOGY - PAP

## 2014-08-03 ENCOUNTER — Encounter: Payer: Self-pay | Admitting: Gynecology

## 2014-08-05 ENCOUNTER — Other Ambulatory Visit: Payer: Self-pay | Admitting: Gynecology

## 2014-08-05 ENCOUNTER — Other Ambulatory Visit: Payer: 59

## 2014-08-05 DIAGNOSIS — R740 Nonspecific elevation of levels of transaminase and lactic acid dehydrogenase [LDH]: Principal | ICD-10-CM

## 2014-08-05 DIAGNOSIS — R7401 Elevation of levels of liver transaminase levels: Secondary | ICD-10-CM

## 2014-08-05 LAB — ALT: ALT: 48 U/L — AB (ref 0–35)

## 2014-08-05 LAB — AST: AST: 39 U/L — AB (ref 0–37)

## 2014-08-06 ENCOUNTER — Telehealth: Payer: Self-pay | Admitting: Internal Medicine

## 2014-08-06 LAB — HEPATITIS PANEL, ACUTE
HCV AB: NEGATIVE
HEP A IGM: NONREACTIVE
HEP B C IGM: NONREACTIVE
HEP B S AG: NEGATIVE

## 2014-08-06 NOTE — Telephone Encounter (Signed)
Spoke with patient and she states she has some slightly elevated LFT's. This was found on her annual GYN OV. She has not added any new medications and has no symptoms. Looks like her GYN is also getting a hept. Panel. She is scheduled for OV on 09/18/14. She just wanted Dr. Olevia Perches to review and see if she needs any other tests or earlier OV.(I have patient's name on list in case of cancellations) Please ,advise.

## 2014-08-06 NOTE — Telephone Encounter (Signed)
Please tell pt that only one  Liver enzyme is slightly elevated.  Dr Toney Rakes ordered repeat LFT's. He ordered Hepatitis serologies which is  Very appropriate. I would not do anything else at this point. Potntial tests may include an ultrasound of liver and  Blood tests for hereditary liver disease or autoimmune liver disease, but only if  LFTs' remain abnormal.

## 2014-08-06 NOTE — Telephone Encounter (Signed)
Patient given recommendations.OV keep on 09/18/14.

## 2014-08-10 ENCOUNTER — Other Ambulatory Visit: Payer: Self-pay | Admitting: Gynecology

## 2014-08-11 ENCOUNTER — Encounter: Payer: Self-pay | Admitting: *Deleted

## 2014-08-26 ENCOUNTER — Ambulatory Visit (INDEPENDENT_AMBULATORY_CARE_PROVIDER_SITE_OTHER): Payer: 59 | Admitting: Internal Medicine

## 2014-08-26 ENCOUNTER — Encounter: Payer: Self-pay | Admitting: Internal Medicine

## 2014-08-26 ENCOUNTER — Other Ambulatory Visit (INDEPENDENT_AMBULATORY_CARE_PROVIDER_SITE_OTHER): Payer: 59

## 2014-08-26 VITALS — BP 130/80 | HR 97 | Ht 65.5 in | Wt 158.2 lb

## 2014-08-26 DIAGNOSIS — R1011 Right upper quadrant pain: Secondary | ICD-10-CM

## 2014-08-26 DIAGNOSIS — R7989 Other specified abnormal findings of blood chemistry: Secondary | ICD-10-CM

## 2014-08-26 DIAGNOSIS — R945 Abnormal results of liver function studies: Secondary | ICD-10-CM

## 2014-08-26 LAB — HEPATIC FUNCTION PANEL
ALK PHOS: 67 U/L (ref 39–117)
ALT: 34 U/L (ref 0–35)
AST: 33 U/L (ref 0–37)
Albumin: 4.5 g/dL (ref 3.5–5.2)
Bilirubin, Direct: 0.1 mg/dL (ref 0.0–0.3)
Total Bilirubin: 0.7 mg/dL (ref 0.2–1.2)
Total Protein: 7.5 g/dL (ref 6.0–8.3)

## 2014-08-26 LAB — PROTIME-INR
INR: 0.9 ratio (ref 0.8–1.0)
Prothrombin Time: 10.1 s (ref 9.6–13.1)

## 2014-08-26 LAB — FERRITIN: Ferritin: 100 ng/mL (ref 10.0–291.0)

## 2014-08-26 NOTE — Patient Instructions (Addendum)
Your physician has requested that you go to the basement for the following lab work before leaving today: AMA, ANA, Ceruloplasmin, Alpha 1 antitrypsin, Ferritin, Hepatic Function, IgG, IgM, PT  You have been scheduled for an abdominal ultrasound at [facility] (1st floor of hospital) on Monday, 08/31/14 at 8:00 am. Please arrive 15 minutes prior to your appointment for registration. Make certain not to have anything to eat or drink 6 hours prior to your appointment. Should you need to reschedule your appointment, please contact radiology at [phone]. This test typically takes about 30 minutes to perform.  Dr J.Fernandez

## 2014-08-26 NOTE — Progress Notes (Signed)
Emily Brandt 1956/09/21 [id number]  Note: This dictation was prepared with Dragon digital system. Any transcriptional errors that result from this procedure are unintentional.   History of Present Illness:  This is a 58 year old white female with mild elevation of transaminases on 2 separate occasions. The last set done in November showed an AST 39 and ALT of 48. Her alkaline phosphatase, albumin and bilirubin are normal. Patient had a prior laparoscopic cholecystectomy in 2013 with findings of cholelithiasis and gallbladder wall adherent to the liver. The surgery was uncomplicated. Since the surgery, she had one small attack similar to biliary colic not as severe approximately 6 months ago. There is no history of significant alcohol intake. There is no family history of liver disease although the information is limited since patient is adopted. Hepatitis A, B, and C serologies have been negative completed by Dr Toney Rakes. She denies easy bruising but has mild fluid retention peripherally. Her weight has been stable. 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 Her level of energy has been good. Patient found out that her mother had colon cancer. She is up-to-date on her colonoscopy.    Past Medical History  Diagnosis Date  . Abnormal finding on Pap smear     Cervicitis  . Endometrial polyp   . Herpes progenitalis   . MVP (mitral valve prolapse)     antibiotic for dental procedures  . Gallstones   . GERD (gastroesophageal reflux disease)   . Arthritis   . Elevated LFTs     Past Surgical History  Procedure Laterality Date  . Hysteroscopy    . Dilation and curettage of uterus    . Nasal septum surgery    . Tonsillectomy    . Cervical fusion    . Shoulder surgery    . Colposcopy    . Nasal septum surgery  1974  . Uterine fibroid surgery  2005  . Tonsillectomy  1965  . Laparoscopic cholecystectomy  09/03/2012    Allergies  Allergen Reactions   . Sulfa Antibiotics Swelling    Family history and social history have been reviewed.  Review of Systems:   The remainder of the 10 point ROS is negative except as outlined in the H&P  Physical Exam: General Appearance Well developed, in no distress Eyes  Non icteric  HEENT  Non traumatic, normocephalic  Mouth No lesion, tongue papillated, no cheilosis Neck Supple without adenopathy, thyroid not enlarged, no carotid bruits, no JVD Lungs Clear to auscultation bilaterally COR Normal S1, normal S2, regular rhythm, no murmur, quiet precordium Abdomen soft nontender with normal active bowel sounds. Liver edge at costal margin. Overall span by percussion about 10 cm. Splenic tip not palpable. No fluid wave Rectal not done Extremities  trace pedal edema Skin No lesions, no spider nevi or palmar erythema Neurological Alert and oriented x 3 no asterixis Psychological Normal mood and affect  Assessment and Plan:   Problem #7 58 year old white female with no significant stigmata of chronic liver disease and only mild elevation of transaminases. She had a prior uncomplicated cholecystectomy and has had a similar attack since then but not as severe. Possible explanations for mild elevation of liver enzymes is a non alcohol related  fatty liver. R/O common bile duct with some backflow of the bile causing mild liver function elevation. Amitriptyline and gabapentin usually don't cause liver enzyme elevation. We are going to check for primary biliary cirrhosis, Wilson's disease, autoimmune hepatitis, hemachromatosis. I'll check alpha 1 antitrypsin, prothrombin time  and serum albumin to confirm normal synthetic activity of the liver. In my opinion, she does not have significant liver impairment and certainly no signs of portal hypertension.  Problem #2 If she is to have another attack of abdominal pain, I would like her to come to have liver function tests checked in our office right away and at that  time may consider a HIDA scan to rule out the possibility of common bile duct sludge.    [name] 08/26/2014

## 2014-08-27 LAB — IGM: IgM, Serum: 92 mg/dL (ref 52–322)

## 2014-08-27 LAB — IGG: IgG (Immunoglobin G), Serum: 876 mg/dL (ref 690–1700)

## 2014-08-28 LAB — ANTI-NUCLEAR AB-TITER (ANA TITER): ANA Titer 1: NEGATIVE

## 2014-08-28 LAB — ANA: Anti Nuclear Antibody(ANA): POSITIVE — AB

## 2014-08-28 LAB — MITOCHONDRIAL ANTIBODIES: Mitochondrial M2 Ab, IgG: 0.58 (ref <0.91)

## 2014-08-31 ENCOUNTER — Ambulatory Visit (HOSPITAL_COMMUNITY)
Admission: RE | Admit: 2014-08-31 | Discharge: 2014-08-31 | Disposition: A | Discharge status: Home / Self Care | Payer: 59 | Source: Ambulatory Visit | Attending: Internal Medicine | Admitting: Internal Medicine

## 2014-08-31 DIAGNOSIS — R1011 Right upper quadrant pain: Secondary | ICD-10-CM | POA: Diagnosis not present

## 2014-08-31 DIAGNOSIS — R7989 Other specified abnormal findings of blood chemistry: Secondary | ICD-10-CM

## 2014-08-31 DIAGNOSIS — R945 Abnormal results of liver function studies: Secondary | ICD-10-CM | POA: Diagnosis present

## 2014-08-31 LAB — CERULOPLASMIN: Ceruloplasmin: 33 mg/dL (ref 18–53)

## 2014-08-31 LAB — ALPHA-1-ANTITRYPSIN: A1 ANTITRYPSIN SER: 147 mg/dL (ref 83–199)

## 2014-09-18 ENCOUNTER — Ambulatory Visit: Payer: 59 | Admitting: Internal Medicine

## 2014-11-18 ENCOUNTER — Other Ambulatory Visit: Payer: Self-pay

## 2014-11-18 DIAGNOSIS — Z1231 Encounter for screening mammogram for malignant neoplasm of breast: Secondary | ICD-10-CM

## 2014-12-14 ENCOUNTER — Ambulatory Visit
Admission: RE | Admit: 2014-12-14 | Discharge: 2014-12-14 | Disposition: A | Discharge status: Home / Self Care | Payer: 59 | Source: Ambulatory Visit

## 2014-12-14 DIAGNOSIS — Z1231 Encounter for screening mammogram for malignant neoplasm of breast: Secondary | ICD-10-CM

## 2015-02-01 ENCOUNTER — Telehealth: Payer: Self-pay | Admitting: *Deleted

## 2015-02-01 ENCOUNTER — Encounter: Payer: Self-pay | Admitting: *Deleted

## 2015-02-01 DIAGNOSIS — R935 Abnormal findings on diagnostic imaging of other abdominal regions, including retroperitoneum: Secondary | ICD-10-CM

## 2015-02-01 NOTE — Telephone Encounter (Signed)
Scheduled CT at [facility] CT on 02/05/15 at 1:30 PM. Contrast at 11:30 and 12:30 PM. NPO after 4 hours. Patient aware Instructions and contrast up front for pick up.

## 2015-02-01 NOTE — Telephone Encounter (Signed)
-----   Message from Hulan Saas, RN sent at 08/31/2014  1:35 PM EST ----- Patient needs CT of liver with hemangioma protocol for DB 6 months f/u.02/24/15

## 2015-02-05 ENCOUNTER — Inpatient Hospital Stay: Admission: RE | Admit: 2015-02-05 | Payer: 59 | Source: Ambulatory Visit

## 2015-02-15 ENCOUNTER — Encounter: Payer: Self-pay | Admitting: Internal Medicine

## 2015-02-19 ENCOUNTER — Ambulatory Visit (INDEPENDENT_AMBULATORY_CARE_PROVIDER_SITE_OTHER)
Admission: RE | Admit: 2015-02-19 | Discharge: 2015-02-19 | Disposition: A | Discharge status: Home / Self Care | Payer: 59 | Source: Ambulatory Visit | Attending: Internal Medicine | Admitting: Internal Medicine

## 2015-02-19 DIAGNOSIS — R935 Abnormal findings on diagnostic imaging of other abdominal regions, including retroperitoneum: Secondary | ICD-10-CM | POA: Diagnosis not present

## 2015-02-19 MED ORDER — IOHEXOL 300 MG/ML  SOLN
100.0000 mL | Freq: Once | INTRAMUSCULAR | Status: AC | PRN
  Administered 2015-02-19: 100 mL via INTRAVENOUS

## 2015-07-27 ENCOUNTER — Encounter: Payer: Self-pay | Admitting: Gynecology

## 2015-07-27 ENCOUNTER — Ambulatory Visit (INDEPENDENT_AMBULATORY_CARE_PROVIDER_SITE_OTHER): Payer: 59 | Admitting: Gynecology

## 2015-07-27 VITALS — BP 138/80 | Ht 65.0 in | Wt 158.0 lb

## 2015-07-27 DIAGNOSIS — Z01419 Encounter for gynecological examination (general) (routine) without abnormal findings: Secondary | ICD-10-CM

## 2015-07-27 DIAGNOSIS — M2550 Pain in unspecified joint: Secondary | ICD-10-CM

## 2015-07-27 DIAGNOSIS — Z780 Asymptomatic menopausal state: Secondary | ICD-10-CM

## 2015-07-27 LAB — CBC WITH DIFFERENTIAL/PLATELET
Basophils Absolute: 0 10*3/uL (ref 0.0–0.1)
Basophils Relative: 0 % (ref 0–1)
EOS ABS: 0.1 10*3/uL (ref 0.0–0.7)
EOS PCT: 1 % (ref 0–5)
HEMATOCRIT: 41.1 % (ref 36.0–46.0)
Hemoglobin: 14 g/dL (ref 12.0–15.0)
LYMPHS ABS: 1.3 10*3/uL (ref 0.7–4.0)
LYMPHS PCT: 19 % (ref 12–46)
MCH: 30.8 pg (ref 26.0–34.0)
MCHC: 34.1 g/dL (ref 30.0–36.0)
MCV: 90.5 fL (ref 78.0–100.0)
MONO ABS: 0.5 10*3/uL (ref 0.1–1.0)
MPV: 10.9 fL (ref 8.6–12.4)
Monocytes Relative: 7 % (ref 3–12)
Neutro Abs: 5 10*3/uL (ref 1.7–7.7)
Neutrophils Relative %: 73 % (ref 43–77)
PLATELETS: 267 10*3/uL (ref 150–400)
RBC: 4.54 MIL/uL (ref 3.87–5.11)
RDW: 12.4 % (ref 11.5–15.5)
WBC: 6.8 10*3/uL (ref 4.0–10.5)

## 2015-07-27 LAB — TSH: TSH: 1.26 u[IU]/mL (ref 0.350–4.500)

## 2015-07-27 MED ORDER — ACYCLOVIR 400 MG PO TABS: ORAL_TABLET | ORAL | Status: DC

## 2015-07-27 MED ORDER — ACYCLOVIR 400 MG PO TABS: 400.0000 mg | ORAL_TABLET | Freq: Every day | ORAL | Status: DC

## 2015-07-27 NOTE — Progress Notes (Signed)
Emily Brandt 06/23/1956 [id number]   History:    59 y.o.  for annual gyn exam  With the only complaint today is being one of joint discomfort  And questionable photosensitivity. Last year she had been referred to the gastroenterologist because of transitory elevated liver function tests see previous note for detail. She has seen Dr. Olevia Perches.  Patient does have past history of cholecystectomy. Her hepatitis panel last year was also negative. Patient is on no hormone replacement therapy. She denies any vasomotor symptoms or any vaginal dryness. Many years ago she had been on hormone replacement therapy. Patient had found out that her biological mother had history of colon cancer. Patient had a normal colonoscopy in 2014. Patient recently had her flu vaccine. She had an abnormal Pap smear in 2003 consistent with low-grade cervical dysplasia. Colposcopy with biopsy failed to confirm it. Since then she's had normal Pap smears. She had a normal Pap smear  In 2015.She takes Valtrex daily for HSV suppression. She reports having had a bone density study in 2014 at another facility and was reported to be normal.  She did have a bone density study here [place] 2011 which was also normal. She takes her calcium and vitamin D.   Patient many years ago had spinal infusion and has some radiculopathy and has been followed by the neurologist at [facility] which has her on Neurontin.  Past medical history,surgical history, family history and social history were all reviewed and documented in the EPIC chart.  Gynecologic History No LMP recorded. Patient is postmenopausal. Contraception: post menopausal status Last Pap:  2015. Results were: normal Last mammogram:  2016. Results were: normal  Obstetric History OB History  Gravida Para Term Preterm AB SAB TAB Ectopic Multiple Living  1    1 1     0    # Outcome Date GA Lbr Len/2nd Weight Sex Delivery Anes PTL Lv  1 SAB                ROS: A ROS was  performed and pertinent positives and negatives are included in the history.  GENERAL: No fevers or chills. HEENT: No change in vision, no earache, sore throat or sinus congestion. NECK: No pain or stiffness. CARDIOVASCULAR: No chest pain or pressure. No palpitations. PULMONARY: No shortness of breath, cough or wheeze. GASTROINTESTINAL: No abdominal pain, nausea, vomiting or diarrhea, melena or bright red blood per rectum. GENITOURINARY: No urinary frequency, urgency, hesitancy or dysuria. MUSCULOSKELETAL: No joint or muscle pain, no back pain, no recent trauma. DERMATOLOGIC: No rash, no itching, no lesions. ENDOCRINE: No polyuria, polydipsia, no heat or cold intolerance. No recent change in weight. HEMATOLOGICAL: No anemia or easy bruising or bleeding. NEUROLOGIC: No headache, seizures, numbness, tingling or weakness. PSYCHIATRIC: No depression, no loss of interest in normal activity or change in sleep pattern.     Exam: chaperone present  BP 138/80 mmHg  Ht 5\' 5"  (1.651 m)  Wt 158 lb (71.668 kg)  BMI 26.29 kg/m2  Body mass index is 26.29 kg/(m^2).  General appearance : Well developed well nourished female. No acute distress HEENT: Eyes: no retinal hemorrhage or exudates,  Neck supple, trachea midline, no carotid bruits, no thyroidmegaly Lungs: Clear to auscultation, no rhonchi or wheezes, or rib retractions  Heart: Regular rate and rhythm, no murmurs or gallops Breast:Examined in sitting and supine position were symmetrical in appearance, no palpable masses or tenderness,  no skin retraction, no nipple inversion, no nipple discharge, no  skin discoloration, no axillary or supraclavicular lymphadenopathy Abdomen: no palpable masses or tenderness, no rebound or guarding Extremities: no edema or skin discoloration or tenderness  Pelvic:  Bartholin, Urethra, Skene Glands: Within normal limits             Vagina: No gross lesions or discharge  Cervix: No gross lesions or discharge  Uterus    anteverted, normal size, shape and consistency, non-tender and mobile  Adnexa  Without masses or tenderness  Anus and perineum  normal   Rectovaginal  normal sphincter tone without palpated masses or tenderness             Hemoccult  Cards will be provided     Assessment/Plan:  59 y.o. female for annual exam  Who is menopausal who is on no hormone replacement therapy. She will have the following screening blood work done today: Fasting lipid profile, comprehensive metabolic panel, TSH, CBC, and urinalysis. Because of her joint discomfort and photosensitivity we will check an ANA  Along with her rheumatoid factor. Pap smear not indicated this year. She was provided with fecal Hemoccult cards to submit to the office for testing. She will schedule a bone density study here in the office in the next few weeks.   Terrance Mass MD, 5:28 PM 07/27/2015

## 2015-07-28 LAB — COMPREHENSIVE METABOLIC PANEL
ALK PHOS: 72 U/L (ref 33–130)
ALT: 34 U/L — AB (ref 6–29)
AST: 27 U/L (ref 10–35)
Albumin: 4.6 g/dL (ref 3.6–5.1)
BUN: 14 mg/dL (ref 7–25)
CALCIUM: 9.4 mg/dL (ref 8.6–10.4)
CHLORIDE: 103 mmol/L (ref 98–110)
CO2: 26 mmol/L (ref 20–31)
Creat: 0.73 mg/dL (ref 0.50–1.05)
GLUCOSE: 94 mg/dL (ref 65–99)
POTASSIUM: 3.9 mmol/L (ref 3.5–5.3)
Sodium: 140 mmol/L (ref 135–146)
Total Bilirubin: 0.6 mg/dL (ref 0.2–1.2)
Total Protein: 7 g/dL (ref 6.1–8.1)

## 2015-07-28 LAB — ANA: Anti Nuclear Antibody(ANA): POSITIVE — AB

## 2015-07-28 LAB — URINALYSIS W MICROSCOPIC + REFLEX CULTURE
BACTERIA UA: NONE SEEN [HPF]
Bilirubin Urine: NEGATIVE
CASTS: NONE SEEN [LPF]
CRYSTALS: NONE SEEN [HPF]
Glucose, UA: NEGATIVE
HGB URINE DIPSTICK: NEGATIVE
LEUKOCYTES UA: NEGATIVE
Nitrite: NEGATIVE
PROTEIN: NEGATIVE
RBC / HPF: NONE SEEN RBC/HPF (ref <=2)
SQUAMOUS EPITHELIAL / LPF: NONE SEEN [HPF] (ref <=5)
Specific Gravity, Urine: 1.013 (ref 1.001–1.035)
WBC, UA: NONE SEEN WBC/HPF (ref <=5)
YEAST: NONE SEEN [HPF]
pH: 5.5 (ref 5.0–8.0)

## 2015-07-28 LAB — LIPID PANEL
CHOL/HDL RATIO: 3 ratio (ref <=5.0)
Cholesterol: 173 mg/dL (ref 125–200)
HDL: 57 mg/dL (ref >=46)
LDL Cholesterol: 94 mg/dL (ref <130)
Triglycerides: 111 mg/dL (ref <150)
VLDL: 22 mg/dL (ref <30)

## 2015-07-28 LAB — ANTI-NUCLEAR AB-TITER (ANA TITER)

## 2015-07-28 LAB — RHEUMATOID FACTOR: Rhuematoid fact SerPl-aCnc: <10 IU/mL (ref <=14)

## 2015-07-30 ENCOUNTER — Telehealth: Payer: Self-pay | Admitting: *Deleted

## 2015-07-30 NOTE — Telephone Encounter (Signed)
Pt aware notes will be faxed,her office will schedule and fax me back with time and date.

## 2015-07-30 NOTE — Telephone Encounter (Signed)
-----   Message from Terrance Mass, MD sent at 07/28/2015  4:52 PM EDT ----- Please inform patient that I would like to refer her to the rheumatologist because of her joint pains. 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 Her rheumatoid factor was normal. Her liver function tests was to same as last year with a value of 34 normal being 6-29 on the SGOT which the rheumatologist will follow. Please make an appointment for her with Dr. Patrecia Pour

## 2015-08-03 ENCOUNTER — Other Ambulatory Visit: Payer: Self-pay | Admitting: Gynecology

## 2015-08-03 ENCOUNTER — Ambulatory Visit (INDEPENDENT_AMBULATORY_CARE_PROVIDER_SITE_OTHER): Payer: 59

## 2015-08-03 DIAGNOSIS — Z780 Asymptomatic menopausal state: Secondary | ICD-10-CM | POA: Diagnosis not present

## 2015-08-03 DIAGNOSIS — M8589 Other specified disorders of bone density and structure, multiple sites: Secondary | ICD-10-CM | POA: Diagnosis not present

## 2015-08-03 DIAGNOSIS — M899 Disorder of bone, unspecified: Secondary | ICD-10-CM | POA: Diagnosis not present

## 2015-08-03 DIAGNOSIS — M8580 Other specified disorders of bone density and structure, unspecified site: Secondary | ICD-10-CM

## 2015-08-03 DIAGNOSIS — Z13820 Encounter for screening for osteoporosis: Secondary | ICD-10-CM | POA: Diagnosis not present

## 2015-08-04 NOTE — Telephone Encounter (Signed)
Dr.Devershwar office did receive note and called pt today and left a message for her to call them to schedule.

## 2015-08-18 NOTE — Telephone Encounter (Signed)
Pt said she is going to call Dr.Deveshwar office.

## 2015-10-23 ENCOUNTER — Encounter (HOSPITAL_COMMUNITY): Payer: Self-pay

## 2015-10-23 ENCOUNTER — Ambulatory Visit (INDEPENDENT_AMBULATORY_CARE_PROVIDER_SITE_OTHER): Payer: 59 | Admitting: Family Medicine

## 2015-10-23 ENCOUNTER — Ambulatory Visit (HOSPITAL_COMMUNITY)
Admission: RE | Admit: 2015-10-23 | Discharge: 2015-10-23 | Disposition: A | Discharge status: Home / Self Care | Payer: 59 | Source: Ambulatory Visit | Attending: Family Medicine | Admitting: Family Medicine

## 2015-10-23 VITALS — BP 130/80 | HR 102 | Temp 98.4°F | Resp 16 | Ht 65.5 in | Wt 164.0 lb

## 2015-10-23 DIAGNOSIS — H5711 Ocular pain, right eye: Secondary | ICD-10-CM | POA: Diagnosis not present

## 2015-10-23 DIAGNOSIS — M4802 Spinal stenosis, cervical region: Secondary | ICD-10-CM

## 2015-10-23 DIAGNOSIS — E079 Disorder of thyroid, unspecified: Secondary | ICD-10-CM | POA: Insufficient documentation

## 2015-10-23 DIAGNOSIS — Z3200 Encounter for pregnancy test, result unknown: Secondary | ICD-10-CM | POA: Diagnosis not present

## 2015-10-23 DIAGNOSIS — H05011 Cellulitis of right orbit: Secondary | ICD-10-CM | POA: Diagnosis not present

## 2015-10-23 DIAGNOSIS — H02401 Unspecified ptosis of right eyelid: Secondary | ICD-10-CM | POA: Diagnosis not present

## 2015-10-23 DIAGNOSIS — I7789 Other specified disorders of arteries and arterioles: Secondary | ICD-10-CM | POA: Insufficient documentation

## 2015-10-23 DIAGNOSIS — Z981 Arthrodesis status: Secondary | ICD-10-CM

## 2015-10-23 DIAGNOSIS — G08 Intracranial and intraspinal phlebitis and thrombophlebitis: Secondary | ICD-10-CM | POA: Diagnosis not present

## 2015-10-23 LAB — POCT URINE PREGNANCY: Preg Test, Ur: NEGATIVE

## 2015-10-23 MED ORDER — IOHEXOL 350 MG/ML SOLN
100.0000 mL | Freq: Once | INTRAVENOUS | Status: AC | PRN
  Administered 2015-10-23: 100 mL via INTRAVENOUS

## 2015-10-23 MED ORDER — IOHEXOL 300 MG/ML  SOLN: 100.0000 mL | Freq: Once | INTRAMUSCULAR | Status: DC | PRN

## 2015-10-23 NOTE — Patient Instructions (Addendum)
Go to [facility] and register at the Emergency Department for OUTPATIENT CTA. DO NOT REGISTER AS ED PATIENT.  We will send you for CT angiography of the head and neck.  Also are checking some blood work on you which will take a few days to come back.  If the CT scan is normal I will do nothing differently until Monday at which time we will be needing to try and work toward getting you in with a neurologist and we will ask for Dr. Ileene Rubens at [facility]  If you're having any progression of symptoms at anytime go to the emergency room.

## 2015-10-23 NOTE — Progress Notes (Signed)
Patient ID: Emily Brandt, female    DOB: Oct 11, 1955  Age: 60 y.o. MRN: [id number]  Chief Complaint  Patient presents with  . Eye Problem    (R) eye x 3 days    Subjective:   Patient is here with problems with her right eye drooping. Around Christmas time she had a day or 2 feeling like her eyelid might be puffy and droopy, but she use warm compresses and it went away. She over the last 3 days has noted a droopiness and puffiness and discomfort of the right eye. It hurts medial behind the upper lid. No other symptoms. No vision changes. No other neuromuscular weakness.  She has been under stress with her husband being a newly diagnosed diabetic yesterday with the high sugar and spending the day in the emergency room.   Current allergies, medications, problem list, past/family and social histories reviewed.  Objective:  BP 130/80 mmHg  Pulse 102  Temp(Src) 98.4 F (36.9 C) (Oral)  Resp 16  Ht 5' 5.5" (1.664 m)  Wt 164 lb (74.39 kg)  BMI 26.87 kg/m2  SpO2 98%  Alert and oriented. She has ptosis of the right upper eyelid. It looks a little puffy on the upper lid and under the right eye is a little puffiness or loss of crease. She has symmetrical pupils. EOMs intact. Fundi benign. No Horner's syndrome can be identified. Patient had no carotid bruit. Took a photo of her on her own phone so she has a record of the ptosis.  Assessment & Plan:   Assessment: 1. Ptosis of eyelid, right   2. Eye pain, right   3. Encounter for pregnancy test       Plan: Isolated ptosis without a Horner's syndrome. Spoke with the neuroradiologist on call, Dr.Hall. We discussed diagnostic workup and I desired ruling out the acute dangerous problems such as dissection. He recommended we get a CT angiography of head and neck. We will also check the acetylcholinesterase panel.  Orders Placed This Encounter  Procedures  . CT Angio Neck W/Cm &/Or Wo/Cm    Standing Status: Future     Number of Occurrences:  1     Standing Expiration Date: 12/20/2016    Order Specific Question:  If indicated for the ordered procedure, I authorize the administration of contrast media per Radiology protocol    Answer:  Yes    Order Specific Question:  Reason for Exam (SYMPTOM  OR DIAGNOSIS REQUIRED)    Answer:  acute ptosis    Order Specific Question:  Is the patient pregnant?    Answer:  No    Order Specific Question:  Preferred imaging location?    Answer:  [facility]  . CT Angio Head W/Cm &/Or Wo Cm    Standing Status: Future     Number of Occurrences: 1     Standing Expiration Date: 12/20/2016    Order Specific Question:  If indicated for the ordered procedure, I authorize the administration of contrast media per Radiology protocol    Answer:  Yes    Order Specific Question:  Reason for Exam (SYMPTOM  OR DIAGNOSIS REQUIRED)    Answer:  acute ptosis    Order Specific Question:  Is the patient pregnant?    Answer:  No    Order Specific Question:  Preferred imaging location?    Answer:  [facility]  . Acetylcholine Receptor, Binding  . Acetylcholine Receptor, Modulating  . Acetylcholine receptor, blocking Abs  . POCT  urine pregnancy        Patient Instructions  Go to [facility] and register at the Emergency Department for OUTPATIENT CTA. DO NOT REGISTER AS ED PATIENT.  We will send you for CT angiography of the head and neck.  Also are checking some blood work on you which will take a few days to come back.  If the CT scan is normal I will do nothing differently until Monday at which time we will be needing to try and work toward getting you in with a neurologist and we will ask for Dr. Ileene Rubens at [facility]  If you're having any progression of symptoms at anytime go to the emergency room.   CT report obtained after the office closed. I spoke to the patient on the phone. Everything was essentially normal. Will refer her to her neurologist for further  evaluations. She knows to come in here for an all worse or go to the emergency room.  Return if symptoms worsen or fail to improve.   HOPPER,DAVID, MD 10/23/2015

## 2015-10-24 ENCOUNTER — Encounter (HOSPITAL_COMMUNITY): Payer: Self-pay

## 2015-10-24 ENCOUNTER — Ambulatory Visit (INDEPENDENT_AMBULATORY_CARE_PROVIDER_SITE_OTHER): Payer: 59 | Admitting: Emergency Medicine

## 2015-10-24 ENCOUNTER — Ambulatory Visit (HOSPITAL_COMMUNITY)
Admission: RE | Admit: 2015-10-24 | Discharge: 2015-10-24 | Disposition: A | Discharge status: Home / Self Care | Payer: 59 | Source: Ambulatory Visit | Attending: Emergency Medicine | Admitting: Emergency Medicine

## 2015-10-24 ENCOUNTER — Other Ambulatory Visit: Payer: Self-pay | Admitting: Emergency Medicine

## 2015-10-24 ENCOUNTER — Other Ambulatory Visit: Payer: Self-pay

## 2015-10-24 ENCOUNTER — Inpatient Hospital Stay (HOSPITAL_COMMUNITY)
Admission: EM | Admit: 2015-10-24 | Discharge: 2015-10-25 | DRG: 121 | Disposition: A | Discharge status: Home / Self Care | Payer: 59 | Attending: Internal Medicine | Admitting: Internal Medicine

## 2015-10-24 VITALS — BP 124/60 | HR 86 | Temp 98.2°F | Resp 18 | Ht 65.5 in | Wt 162.0 lb

## 2015-10-24 DIAGNOSIS — H5711 Ocular pain, right eye: Secondary | ICD-10-CM

## 2015-10-24 DIAGNOSIS — E041 Nontoxic single thyroid nodule: Secondary | ICD-10-CM | POA: Diagnosis not present

## 2015-10-24 DIAGNOSIS — H05011 Cellulitis of right orbit: Principal | ICD-10-CM | POA: Diagnosis present

## 2015-10-24 DIAGNOSIS — H02401 Unspecified ptosis of right eyelid: Secondary | ICD-10-CM | POA: Diagnosis not present

## 2015-10-24 DIAGNOSIS — I341 Nonrheumatic mitral (valve) prolapse: Secondary | ICD-10-CM | POA: Diagnosis present

## 2015-10-24 DIAGNOSIS — G08 Intracranial and intraspinal phlebitis and thrombophlebitis: Secondary | ICD-10-CM | POA: Diagnosis not present

## 2015-10-24 DIAGNOSIS — Z79899 Other long term (current) drug therapy: Secondary | ICD-10-CM

## 2015-10-24 DIAGNOSIS — L03213 Periorbital cellulitis: Secondary | ICD-10-CM | POA: Diagnosis present

## 2015-10-24 DIAGNOSIS — Z981 Arthrodesis status: Secondary | ICD-10-CM | POA: Diagnosis not present

## 2015-10-24 DIAGNOSIS — I671 Cerebral aneurysm, nonruptured: Secondary | ICD-10-CM | POA: Diagnosis not present

## 2015-10-24 DIAGNOSIS — Z882 Allergy status to sulfonamides status: Secondary | ICD-10-CM | POA: Diagnosis not present

## 2015-10-24 DIAGNOSIS — M3500 Sicca syndrome, unspecified: Secondary | ICD-10-CM | POA: Diagnosis present

## 2015-10-24 LAB — POCT CBC
Granulocyte percent: 68.2 %G (ref 37–80)
HCT, POC: 42.1 % (ref 37.7–47.9)
HEMOGLOBIN: 14.3 g/dL (ref 12.2–16.2)
Lymph, poc: 1.4 (ref 0.6–3.4)
MCH: 30.4 pg (ref 27–31.2)
MCHC: 34 g/dL (ref 31.8–35.4)
MCV: 89.4 fL (ref 80–97)
MID (cbc): 0.4 (ref 0–0.9)
MPV: 8.5 fL (ref 0–99.8)
POC Granulocyte: 3.8 (ref 2–6.9)
POC LYMPH PERCENT: 24.7 %L (ref 10–50)
POC MID %: 7.1 % (ref 0–12)
Platelet Count, POC: 212 10*3/uL (ref 142–424)
RBC: 4.71 M/uL (ref 4.04–5.48)
RDW, POC: 12.4 %
WBC: 5.6 10*3/uL (ref 4.6–10.2)

## 2015-10-24 LAB — THYROID PANEL WITH TSH
Free Thyroxine Index: 3 (ref 1.4–3.8)
T3 UPTAKE: 28 % (ref 22–35)
T4, Total: 10.6 ug/dL (ref 4.5–12.0)
TSH: 1.322 u[IU]/mL (ref 0.350–4.500)

## 2015-10-24 LAB — COMPREHENSIVE METABOLIC PANEL
ALK PHOS: 66 U/L (ref 38–126)
ALT: 35 U/L (ref 14–54)
AST: 35 U/L (ref 15–41)
Albumin: 4.5 g/dL (ref 3.5–5.0)
Anion gap: 13 (ref 5–15)
BUN: 14 mg/dL (ref 6–20)
CALCIUM: 9.9 mg/dL (ref 8.9–10.3)
CO2: 24 mmol/L (ref 22–32)
CREATININE: 0.72 mg/dL (ref 0.44–1.00)
Chloride: 104 mmol/L (ref 101–111)
Glucose, Bld: 94 mg/dL (ref 65–99)
Potassium: 4.4 mmol/L (ref 3.5–5.1)
Sodium: 141 mmol/L (ref 135–145)
TOTAL PROTEIN: 7.4 g/dL (ref 6.5–8.1)
Total Bilirubin: 0.7 mg/dL (ref 0.3–1.2)

## 2015-10-24 LAB — CBC WITH DIFFERENTIAL/PLATELET
Basophils Absolute: 0 10*3/uL (ref 0.0–0.1)
Basophils Relative: 0 %
EOS PCT: 2 %
Eosinophils Absolute: 0.1 10*3/uL (ref 0.0–0.7)
HCT: 44.7 % (ref 36.0–46.0)
HEMOGLOBIN: 14.6 g/dL (ref 12.0–15.0)
LYMPHS ABS: 1.5 10*3/uL (ref 0.7–4.0)
LYMPHS PCT: 22 %
MCH: 30.7 pg (ref 26.0–34.0)
MCHC: 32.7 g/dL (ref 30.0–36.0)
MCV: 93.9 fL (ref 78.0–100.0)
MONOS PCT: 8 %
Monocytes Absolute: 0.5 10*3/uL (ref 0.1–1.0)
NEUTROS PCT: 68 %
Neutro Abs: 4.6 10*3/uL (ref 1.7–7.7)
Platelets: 228 10*3/uL (ref 150–400)
RBC: 4.76 MIL/uL (ref 3.87–5.11)
RDW: 12.3 % (ref 11.5–15.5)
WBC: 6.8 10*3/uL (ref 4.0–10.5)

## 2015-10-24 LAB — I-STAT CG4 LACTIC ACID, ED
LACTIC ACID, VENOUS: 1.55 mmol/L (ref 0.5–2.0)
LACTIC ACID, VENOUS: 1.43 mmol/L (ref 0.5–2.0)

## 2015-10-24 LAB — POCT I-STAT CREATININE: Creatinine, Ser: 0.9 mg/dL (ref 0.44–1.00)

## 2015-10-24 LAB — POCT SEDIMENTATION RATE: POCT SED RATE: 19 mm/h (ref 0–22)

## 2015-10-24 MED ORDER — OXYCODONE HCL 5 MG PO TABS: 5.0000 mg | ORAL_TABLET | ORAL | Status: DC | PRN

## 2015-10-24 MED ORDER — NORTRIPTYLINE HCL 25 MG PO CAPS
50.0000 mg | ORAL_CAPSULE | Freq: Every day | ORAL | Status: DC
Start: 2015-10-24 — End: 2015-10-25
  Administered 2015-10-24: 50 mg via ORAL
  Filled 2015-10-24: qty 2

## 2015-10-24 MED ORDER — ACETAMINOPHEN 325 MG PO TABS: 650.0000 mg | ORAL_TABLET | Freq: Four times a day (QID) | ORAL | Status: DC | PRN

## 2015-10-24 MED ORDER — AMOXICILLIN-POT CLAVULANATE 875-125 MG PO TABS: 1.0000 | ORAL_TABLET | Freq: Two times a day (BID) | ORAL | Status: DC

## 2015-10-24 MED ORDER — ACYCLOVIR 200 MG PO CAPS
400.0000 mg | ORAL_CAPSULE | Freq: Every day | ORAL | Status: DC
Start: 2015-10-25 — End: 2015-10-25
  Administered 2015-10-25: 400 mg via ORAL
  Filled 2015-10-24: qty 2

## 2015-10-24 MED ORDER — VALACYCLOVIR HCL 1 G PO TABS: 1000.0000 mg | ORAL_TABLET | Freq: Three times a day (TID) | ORAL | Status: DC

## 2015-10-24 MED ORDER — OMEGA-3-ACID ETHYL ESTERS 1 G PO CAPS
1.0000 g | ORAL_CAPSULE | Freq: Every day | ORAL | Status: DC
  Administered 2015-10-25: 1 g via ORAL
  Filled 2015-10-24: qty 1

## 2015-10-24 MED ORDER — PIPERACILLIN-TAZOBACTAM 3.375 G IVPB
3.3750 g | Freq: Three times a day (TID) | INTRAVENOUS | Status: DC
  Administered 2015-10-25 (×2): 3.375 g via INTRAVENOUS
  Filled 2015-10-24 (×4): qty 50

## 2015-10-24 MED ORDER — LORAZEPAM 2 MG/ML IJ SOLN
2.0000 mg | Freq: Once | INTRAMUSCULAR | Status: AC
  Administered 2015-10-25: 2 mg via INTRAVENOUS
  Filled 2015-10-24: qty 1

## 2015-10-24 MED ORDER — ONDANSETRON HCL 4 MG/2ML IJ SOLN: 4.0000 mg | Freq: Four times a day (QID) | INTRAMUSCULAR | Status: DC | PRN

## 2015-10-24 MED ORDER — ONDANSETRON HCL 4 MG PO TABS: 4.0000 mg | ORAL_TABLET | Freq: Four times a day (QID) | ORAL | Status: DC | PRN

## 2015-10-24 MED ORDER — SODIUM CHLORIDE 0.9 % IV SOLN
INTRAVENOUS | Status: DC
  Administered 2015-10-24: 23:00:00 via INTRAVENOUS

## 2015-10-24 MED ORDER — PIPERACILLIN-TAZOBACTAM 3.375 G IVPB 30 MIN
3.3750 g | INTRAVENOUS | Status: AC
  Administered 2015-10-24: 3.375 g via INTRAVENOUS
  Filled 2015-10-24: qty 50

## 2015-10-24 MED ORDER — IOHEXOL 300 MG/ML  SOLN
100.0000 mL | Freq: Once | INTRAMUSCULAR | Status: AC | PRN
  Administered 2015-10-24: 80 mL via INTRAVENOUS

## 2015-10-24 MED ORDER — VANCOMYCIN HCL IN DEXTROSE 1-5 GM/200ML-% IV SOLN
1000.0000 mg | INTRAVENOUS | Status: AC
  Administered 2015-10-24: 1000 mg via INTRAVENOUS
  Filled 2015-10-24: qty 200

## 2015-10-24 MED ORDER — SENNOSIDES-DOCUSATE SODIUM 8.6-50 MG PO TABS: 1.0000 | ORAL_TABLET | Freq: Every evening | ORAL | Status: DC | PRN

## 2015-10-24 MED ORDER — ACETAMINOPHEN 650 MG RE SUPP: 650.0000 mg | Freq: Four times a day (QID) | RECTAL | Status: DC | PRN

## 2015-10-24 MED ORDER — AMOXICILLIN 875 MG PO TABS: 875.0000 mg | ORAL_TABLET | Freq: Two times a day (BID) | ORAL | Status: DC

## 2015-10-24 MED ORDER — ADULT MULTIVITAMIN W/MINERALS CH
1.0000 | ORAL_TABLET | Freq: Every day | ORAL | Status: DC
Start: 2015-10-25 — End: 2015-10-25
  Administered 2015-10-25: 1 via ORAL
  Filled 2015-10-24: qty 1

## 2015-10-24 MED ORDER — GABAPENTIN 400 MG PO CAPS
400.0000 mg | ORAL_CAPSULE | Freq: Three times a day (TID) | ORAL | Status: DC
  Administered 2015-10-24 – 2015-10-25 (×2): 400 mg via ORAL
  Filled 2015-10-24 (×2): qty 1

## 2015-10-24 MED ORDER — VANCOMYCIN HCL IN DEXTROSE 1-5 GM/200ML-% IV SOLN
1000.0000 mg | Freq: Two times a day (BID) | INTRAVENOUS | Status: DC
  Administered 2015-10-25: 1000 mg via INTRAVENOUS
  Filled 2015-10-24 (×3): qty 200

## 2015-10-24 MED ORDER — ENOXAPARIN SODIUM 40 MG/0.4ML ~~LOC~~ SOLN
40.0000 mg | SUBCUTANEOUS | Status: DC
  Administered 2015-10-25: 40 mg via SUBCUTANEOUS
  Filled 2015-10-24: qty 0.4

## 2015-10-24 NOTE — Progress Notes (Signed)
i was called by hospitalist at [facility] hospital to transfer pt with new diagnosis of cavernous sinus thrombosis. Reason for Neurology consult at [facility]. Hospitalist said care plan has been placed for pt and neurology was contacted.

## 2015-10-24 NOTE — Consult Note (Signed)
CC:  Chief Complaint  Patient presents with  . Eye Problem    HPI: Emily Brandt is a 60 y.o. female w/ POH of RE and unremarkable PMH who presents for evaluation of right eye pain and drooping. Was in the hospital w/ husband in ED past Friday (3 days ago). Around 5 days ago noted some pain in the right corner and continued to worsen up until 3 days ago.  Maybe a previous episode around Christmas. Saw family medicine on Saturday and was sent to ED because of ptosis to rule out stroke. Instructed to come back if it got worse and thus came back today. Pain dull and aching. No sharp pain. No diplopia. No recent trauma. + plucking of eyebrows. Some mild periorbital swelling on initial. No URI symptoms.   Denies history of breast cancer or lymphoma/leukemia.   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 Abnormal finding on Pap smear     Cervicitis  . Endometrial polyp   . Herpes progenitalis   . MVP (mitral valve prolapse)     antibiotic for dental procedures  . Gallstones   . GERD (gastroesophageal reflux disease)   . Arthritis   . Elevated LFTs     PSH: Past Surgical History  Procedure Laterality Date  . Hysteroscopy    . Dilation and curettage of uterus    . Nasal septum surgery    . Tonsillectomy    . Cervical fusion    . Shoulder surgery    . Colposcopy    . Nasal septum surgery  1974  . Uterine fibroid surgery  2005  . Tonsillectomy  1965  . Laparoscopic cholecystectomy  09/03/2012    Meds: No current facility-administered medications on file prior to encounter.    Current Outpatient Prescriptions on File Prior to Encounter  Medication Sig Dispense Refill  . acyclovir (ZOVIRAX) 400 MG tablet Take 1 tablet (400 mg total) by mouth daily. 30 tablet 10  . BIOTIN PO Take 1 tablet by mouth daily.    Marland Kitchen CALCIUM PO Take 1 tablet by mouth daily.     . Coenzyme Q10 (CO Q 10 PO) Take 1 tablet by mouth daily.    . fish oil-omega-3 fatty acids 1000 MG capsule Take 1 g by mouth daily.      . Multiple Vitamin (MULTIVITAMIN) tablet Take 1 tablet by mouth daily.      . NON FORMULARY Take 1 capsule by mouth daily. Primrose oil daily    . amoxicillin-clavulanate (AUGMENTIN) 875-125 MG tablet Take 1 tablet by mouth 2 (two) times daily. 20 tablet 0    SH: Social History   Social History  . Marital Status: Married    Spouse Name: N/A  . Number of Children: N/A  . Years of Education: N/A   Social History Main Topics  . Smoking status: Never Smoker   . Smokeless tobacco: Never Used  . Alcohol Use: 0.0 oz/week    1-2 Glasses of wine per week     Comment: occas  . Drug Use: No  . Sexual Activity: Not Currently    Birth Control/ Protection: Post-menopausal   Other Topics Concern  .  None   Social History Narrative    FH: Family History  Problem Relation Age of Onset  . Adopted: Yes  . Colon cancer Mother   . Hypertension Mother   . Osteoporosis Mother   . Cancer Mother   . Heart disease Sister   . Diabetes Maternal Grandmother   . Heart disease Maternal Grandfather   . Esophageal cancer Neg Hx   . Rectal cancer Neg Hx   . Stomach cancer Neg Hx     Exam:  Lucianne Lei: OD: 3 pt cc OS: 3 pt cc  CVF: OD: full OS: full  EOM: OD: grossly full d/v OS: grossly full d/v  Maddox rod: comitant exo. Left hyperotropia worst on up right gaze  Pupils: OD: 4->2 mm, no APD OS: 4->2 mm, no APD  External: OD: no periorbital edema, no proptosis, V1-V3 intact and symmetric, good orbicularis strength - no evidence of dacryoadenitis OS: no periorbital  edema, no proptosis, V1-V3 intact and symmetric, good orbicularis strength - no evidence of dacryoadenitis  Hertel: 14/13.5 (105)  PF: 8/10  MRD1: 3/5  ULE: 12/15  IOP: 15/15  Pen Light Exam: L/L: OD: WNL OS: WNL  C/S: OD: white and quiet OS: white and quiet  K: OD: clear, no abnormal staining OS: clear, no abnormal staining  A/C: OD: grossly deep and quiet appearing by pen light OS: grossly deep and quiet appearing by pen light  L: OD: NSC OS: NSC  DFE: Tropic/Phenyl @ 6:30 PM  V: OD: clear OS: clear  N: OD: C/D 0.3, no disc edema OS: C/D 0.25, no disc edema  M: OD: flat, no obvious macular pathology OS: flat, no obvious macular pathology  V: OD: normal appearing vessels OS: normal appearing vessels  P: OD: retina flat 360, no obvious mass/RT/RD OS: retina flat 360, no obvious mass/RT/RD  A/P:  1. Right Side Ptosis: - Etiology from CT findings - no evidence of associated CN III, CN IV, CN V, CN VI palsy to suggest cavernous sinus thrombosis - This would be very atypical for cavernous sinus thrombosis to present w/ isolated mild ptosis (typically complete w/ CN III palsy as well as associated CN palsy)  2. Right Orbital Mass: - DDx: viral dacryoadenitis, autoimmune dacryoadenitis, idiopathic orbital inflammation of lacrimal gland, or lymphoma -- less likely adenoid cystic carcinoma (no bony erosion on CT) - Recommend addition of differential to CBC to rule out lymphoma - Given positive ANA - recommend ACE, serum lysozyme, ANCA, anti-Ro, anti-La for evaluation of autoimmune process - Recommend further characterization with MRI Orbit w/ and w/o contrast - Discussed that unlikely to be from bacterial infection and CT findings may ultimately require evaluation w/ oculoplastic for possible biopsy (Ex: Dr. Rosendo Gros at [facility])  Harrell Gave T. Manuella Ghazi, [place]

## 2015-10-24 NOTE — Progress Notes (Signed)
Called to MRI to notify STAT  MRI orders.

## 2015-10-24 NOTE — ED Notes (Signed)
She remains in no distress and also remains oriented x 4 with clear speech.

## 2015-10-24 NOTE — Progress Notes (Signed)
Report received from Roderic Palau, RN from [facility] ED. Pt will be admitted here for neurology and opthalmology evaluation. Awaiting pt's arrival.

## 2015-10-24 NOTE — Patient Instructions (Addendum)
Go for CT orbit today, now at [place] see Dr Patrice Paradise at Dr Katy Fitch office tomorrow morning at 8:30 am

## 2015-10-24 NOTE — Progress Notes (Addendum)
NURSING PROGRESS NOTE  Emily Brandt [id number] Admission Data: 10/24/2015 10:09 PM Attending Provider: Gennaro Africa, MD PCP:No PCP Per Patient Code Status: full   Emily Brandt is a 60 y.o. female patient admitted from [facility]  ED:  -No acute distress noted.  -No complaints of shortness of breath.  -No complaints of chest pain.    Blood pressure 159/80, pulse 94, temperature 98.3 F (36.8 C), temperature source Oral, resp. rate 18, SpO2 100 %.   IV Fluids:  IV in place, occlusive dsg intact without redness, IV cath hand right, condition patent and no redness none.   Allergies:  Sulfa antibiotics  Past Medical History:   has a past medical history of Abnormal finding on Pap smear; Endometrial polyp; Herpes progenitalis; MVP (mitral valve prolapse); Gallstones; GERD (gastroesophageal reflux disease); Arthritis; and Elevated LFTs.  Past Surgical History:   has past surgical history that includes Hysteroscopy; Dilation and curettage of uterus; Nasal septum surgery; Tonsillectomy; Cervical fusion; Shoulder surgery; Colposcopy; Nasal septum surgery (1974); Uterine fibroid surgery (2005); Tonsillectomy (1965); and Laparoscopic cholecystectomy (09/03/2012).  Social History:   reports that she has never smoked. She has never used smokeless tobacco. She reports that she drinks alcohol. She reports that she does not use illicit drugs.  Skin: intact  Patient/Family orientated to room. Information packet given to patient/family. Admission inpatient armband information verified with patient/family to include name and date of birth and placed on patient arm. Side rails up x 2, fall assessment and education completed with patient/family. Patient/family able to verbalize understanding of risk associated with falls and verbalized understanding to call for assistance before getting out of bed. Call light within reach. Patient/family able to voice and demonstrate understanding of unit orientation  instructions.

## 2015-10-24 NOTE — Progress Notes (Signed)
ANTIBIOTIC CONSULT NOTE - INITIAL  Pharmacy Consult for Zosyn and Vancomycin Indication: Preseptal cellulitis/cavernous sinus thrombosis  Allergies  Allergen Reactions  . Sulfa Antibiotics Swelling    Patient Measurements:   Adjusted Body Weight:   Vital Signs: Temp: 98.4 F (36.9 C) (01/22 1447) Temp Source: Oral (01/22 1447) BP: 169/89 mmHg (01/22 1447) Pulse Rate: 102 (01/22 1447) Intake/Output from previous day:   Intake/Output from this shift:    Labs:  Recent Labs  10/24/15 1130 10/24/15 1344  WBC 5.6  --   HGB 14.3  --   CREATININE  --  0.90   Estimated Creatinine Clearance: 68.3 mL/min (by C-G formula based on Cr of 0.9). No results for input(s): VANCOTROUGH, VANCOPEAK, VANCORANDOM, GENTTROUGH, GENTPEAK, GENTRANDOM, TOBRATROUGH, TOBRAPEAK, TOBRARND, AMIKACINPEAK, AMIKACINTROU, AMIKACIN in the last 72 hours.   Microbiology: No results found for this or any previous visit (from the past 720 hour(s)).  Medical History: Past Medical History  Diagnosis Date  . Abnormal finding on Pap smear     Cervicitis  . Endometrial polyp   . Herpes progenitalis   . MVP (mitral valve prolapse)     antibiotic for dental procedures  . Gallstones   . GERD (gastroesophageal reflux disease)   . Arthritis   . Elevated LFTs     Medications:  Anti-infectives    None     Assessment: 59yo F w/ eye pain. CT showed preseptal cellulitis with questionable cavernous sinus thrombosis. Pharmacy is asked to dose Vanc and Zosyn. SCr is wnl, CrCl ~53ml/min.  Goal of Therapy:  Vancomycin trough level 15-20 mcg/ml  Appropriate antibiotic dosing for renal function; eradication of infection  Plan:  Vancomycin 1g IV q12h. Zosyn 3.375g IV Q8H infused over 4hrs. Measure Vanc trough at steady state. Follow up renal fxn, culture results, and clinical course.  Romeo Rabon, PharmD, pager [phone]. 10/24/2015,4:58 PM.

## 2015-10-24 NOTE — Consult Note (Signed)
Admission H&P    Chief Complaint: Pain and swelling involving right eye.  HPI: Emily Brandt is an 60 y.o. female history of mitral valve prolapse and arthritis, presenting with pain and swelling involving her right eye started 4 days prior to evaluation. She has not experienced visual changes involving her right eye. She also has not experienced diplopia CT scan of her head showed no acute intracranial abnormality. CT scan of the orbits showed signs consistent with preseptal cellulitis. Evaluation of the cavernous portion of the internal carotid arteries was limited due to venous contamination. Neurology consultation was obtained to rule out possible right cavernous sinus thrombosis. She was started on Zosyn and vancomycin. MRI of the orbits and MRV of the brain are pending.  Past Medical History  Diagnosis Date  . Abnormal finding on Pap smear     Cervicitis  . Endometrial polyp   . Herpes progenitalis   . MVP (mitral valve prolapse)     antibiotic for dental procedures  . Gallstones   . GERD (gastroesophageal reflux disease)   . Arthritis   . Elevated LFTs     Past Surgical History  Procedure Laterality Date  . Hysteroscopy    . Dilation and curettage of uterus    . Nasal septum surgery    . Tonsillectomy    . Cervical fusion    . Shoulder surgery    . Colposcopy    . Nasal septum surgery  1974  . Uterine fibroid surgery  2005  . Tonsillectomy  1965  . Laparoscopic cholecystectomy  09/03/2012    Family History  Problem Relation Age of Onset  . Adopted: Yes  . Colon cancer Mother   . Hypertension Mother   . Osteoporosis Mother   . Cancer Mother   . Heart disease Sister   . Diabetes Maternal Grandmother   . Heart disease Maternal Grandfather   . Esophageal cancer Neg Hx   . Rectal cancer Neg Hx   . Stomach cancer Neg Hx    Social History:  reports that she has never smoked. She has never used smokeless tobacco. She reports that she drinks alcohol. She reports that  she does not use illicit drugs.  Allergies:  Allergies  Allergen Reactions  . Sulfa Antibiotics Swelling    Medications Prior to Admission  Medication Sig Dispense Refill  . acyclovir (ZOVIRAX) 400 MG tablet Take 1 tablet (400 mg total) by mouth daily. 30 tablet 10  . BIOTIN PO Take 1 tablet by mouth daily.    Marland Kitchen CALCIUM PO Take 1 tablet by mouth daily.     . Coenzyme Q10 (CO Q 10 PO) Take 1 tablet by mouth daily.    . fish oil-omega-3 fatty acids 1000 MG capsule Take 1 g by mouth daily.      Marland Kitchen gabapentin (NEURONTIN) 400 MG capsule Take 400 mg by mouth 3 (three) times daily.  11  . Ginger, Zingiber officinalis, (GINGER ROOT PO) Take 1 capsule by mouth daily.    . Multiple Vitamin (MULTIVITAMIN) tablet Take 1 tablet by mouth daily.      . NON FORMULARY Take 1 capsule by mouth daily. Primrose oil daily    . nortriptyline (PAMELOR) 50 MG capsule TAKE 1 CAPSULE (50 MG TOTAL) BY MOUTH AT BEDTIME  11  . TURMERIC PO Take 1 capsule by mouth daily.    Marland Kitchen amoxicillin-clavulanate (AUGMENTIN) 875-125 MG tablet Take 1 tablet by mouth 2 (two) times daily. 20 tablet 0    ROS:  History obtained from the patient  General ROS: negative for - chills, fatigue, fever, night sweats, weight gain or weight loss Psychological ROS: negative for - behavioral disorder, hallucinations, memory difficulties, mood swings or suicidal ideation Ophthalmic ROS: negative for - blurry vision, double vision, eye pain or loss of vision ENT ROS: negative for - epistaxis, nasal discharge, oral lesions, sore throat, tinnitus or vertigo Allergy and Immunology ROS: negative for - hives or itchy/watery eyes Hematological and Lymphatic ROS: negative for - bleeding problems, bruising or swollen lymph nodes Endocrine ROS: negative for - galactorrhea, hair pattern changes, polydipsia/polyuria or temperature intolerance Respiratory ROS: negative for - cough, hemoptysis, shortness of breath or wheezing Cardiovascular ROS: negative for  - chest pain, dyspnea on exertion, edema or irregular heartbeat Gastrointestinal ROS: negative for - abdominal pain, diarrhea, hematemesis, nausea/vomiting or stool incontinence Genito-Urinary ROS: negative for - dysuria, hematuria, incontinence or urinary frequency/urgency Musculoskeletal ROS: negative for - joint swelling or muscular weakness Neurological ROS: as noted in HPI Dermatological ROS: negative for rash and skin lesion changes  Physical Examination: Blood pressure 159/80, pulse 94, temperature 98.3 F (36.8 C), temperature source Oral, resp. rate 18, SpO2 100 %.  HEENT-  Normocephalic, no lesions, without obvious abnormality.  Normal external eye and conjunctiva.  Normal TM's bilaterally.  Normal auditory canals and external ears. Normal external nose, mucus membranes and septum.  Normal pharynx. Neck supple with no masses, nodes, nodules or enlargement. Cardiovascular - regular rate and rhythm, S1, S2 normal, no murmur, click, rub or gallop Lungs - chest clear, no wheezing, rales, normal symmetric air entry Abdomen - soft, non-tender; bowel sounds normal; no masses,  no organomegaly Extremities - no joint deformities, effusion, or inflammation and no edema  Neurologic Examination: Mental Status: Alert, oriented, thought content appropriate.  Speech fluent without evidence of aphasia. Able to follow commands without difficulty. Cranial Nerves: II-Visual fields were normal. III/IV/VI-Pupils were dilated from earlier ophthalmologic examination were equal and reacted normally to light. Extraocular movements were full and conjugate with no diplopia in any field of gaze.    V/VII-no facial numbness and no facial weakness. VIII-normal. X-normal speech and symmetrical palatal movement. XI: trapezius strength/neck flexion strength normal bilaterally XII-midline tongue extension with normal strength. Motor: 5/5 bilaterally with normal tone and bulk Sensory: Normal throughout. Deep  Tendon Reflexes: 2+ and symmetric. Plantars: Mute bilaterally Cerebellar: Normal finger-to-nose testing. Carotid auscultation: Normal  Results for orders placed or performed during the hospital encounter of 10/24/15 (from the past 48 hour(s))  CBC with Differential/Platelet     Status: None   Collection Time: 10/24/15  5:03 PM  Result Value Ref Range   WBC 6.8 4.0 - 10.5 K/uL   RBC 4.76 3.87 - 5.11 MIL/uL   Hemoglobin 14.6 12.0 - 15.0 g/dL   HCT 44.7 36.0 - 46.0 %   MCV 93.9 78.0 - 100.0 fL   MCH 30.7 26.0 - 34.0 pg   MCHC 32.7 30.0 - 36.0 g/dL   RDW 12.3 11.5 - 15.5 %   Platelets 228 150 - 400 K/uL   Neutrophils Relative % 68 %   Neutro Abs 4.6 1.7 - 7.7 K/uL   Lymphocytes Relative 22 %   Lymphs Abs 1.5 0.7 - 4.0 K/uL   Monocytes Relative 8 %   Monocytes Absolute 0.5 0.1 - 1.0 K/uL   Eosinophils Relative 2 %   Eosinophils Absolute 0.1 0.0 - 0.7 K/uL   Basophils Relative 0 %   Basophils Absolute 0.0 0.0 - 0.1 K/uL  Comprehensive metabolic panel     Status: None   Collection Time: 10/24/15  5:03 PM  Result Value Ref Range   Sodium 141 135 - 145 mmol/L   Potassium 4.4 3.5 - 5.1 mmol/L   Chloride 104 101 - 111 mmol/L   CO2 24 22 - 32 mmol/L   Glucose, Bld 94 65 - 99 mg/dL   BUN 14 6 - 20 mg/dL   Creatinine, Ser 0.72 0.44 - 1.00 mg/dL   Calcium 9.9 8.9 - 10.3 mg/dL   Total Protein 7.4 6.5 - 8.1 g/dL   Albumin 4.5 3.5 - 5.0 g/dL   AST 35 15 - 41 U/L   ALT 35 14 - 54 U/L   Alkaline Phosphatase 66 38 - 126 U/L   Total Bilirubin 0.7 0.3 - 1.2 mg/dL   GFR calc non Af Amer >60 >60 mL/min   GFR calc Af Amer >60 >60 mL/min    Comment: (NOTE) The eGFR has been calculated using the CKD EPI equation. This calculation has not been validated in all clinical situations. eGFR's persistently <60 mL/min signify possible Chronic Kidney Disease.    Anion gap 13 5 - 15  I-Stat CG4 Lactic Acid, ED     Status: None   Collection Time: 10/24/15  5:15 PM  Result Value Ref Range   Lactic  Acid, Venous 1.55 0.5 - 2.0 mmol/L  I-Stat CG4 Lactic Acid, ED     Status: None   Collection Time: 10/24/15  8:32 PM  Result Value Ref Range   Lactic Acid, Venous 1.43 0.5 - 2.0 mmol/L   Ct Angio Head W/cm &/or Wo Cm  10/24/2015  ADDENDUM REPORT: 10/24/2015 13:12 ADDENDUM: Per discussion with Dr. Everlene Farrier, there is slight infiltration of fat planes in the right preseptal region and anterior to the right lateral orbital wall suggesting preseptal inflammation. No postseptal extension. Electronically Signed   By: Genia Del M.D.   On: 10/24/2015 13:12  10/24/2015  CLINICAL DATA:  60 year old female with right ptosis. Initial encounter. EXAM: CT ANGIOGRAPHY HEAD AND NECK TECHNIQUE: Multidetector CT imaging of the head and neck was performed using the standard protocol during bolus administration of intravenous contrast. Multiplanar CT image reconstructions and MIPs were obtained to evaluate the vascular anatomy. Carotid stenosis measurements (when applicable) are obtained utilizing NASCET criteria, using the distal internal carotid diameter as the denominator. CONTRAST:  180m OMNIPAQUE IOHEXOL 350 MG/ML SOLN COMPARISON:  None. FINDINGS: CT HEAD Brain: No intracranial hemorrhage or CT evidence of large acute infarct. No intracranial mass or abnormal enhancement. No hydrocephalus. Calvarium and skull base: Negative. Paranasal sinuses: Clear. Orbits: Symmetric normal appearance of orbital structures including extra-ocular muscles. Symmetric normal appearance of superior ophthalmic veins and cavernous sinus. CTA NECK Aortic arch: 3 vessel aortic arch. Right carotid system: No evidence of dissection. Left carotid system: No evidence of dissection. Vertebral arteries:Evaluation limited secondary to phase of contrast injection. 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 Other neck: Heterogeneous partially calcified  thyroid gland with dominant lesion on the left measuring up to 2 cm. Thyroid ultrasound recommended for further delineation. CTA HEAD Anterior circulation: Evaluation limited by phase of contrast injection. No posterior communicating artery aneurysm noted. Limited evaluation cavernous segment of the internal carotid artery secondary to venous contamination. Posterior circulation: Right vertebral artery is small after takeoff of the right posterior inferior cerebellar artery. Ectatic vertebral arteries and basilar artery. Tiny bulge origin of the  left superior cerebellar artery (1.1 mm). This may be related to congenital bulge secondary to fetal type origin of the left posterior cerebral artery. Stability can be confirmed on follow-up MR angiogram in 6 months. Venous sinuses: Negative. Anatomic variants: As above. Delayed phase: As above. IMPRESSION: Present exam was performed in a delayed fashion secondary to interruption of the scanner toward injection. This limits evaluation particularly evaluation of intracranial vasculature. No evidence of dissection of the internal carotid artery from the bifurcation to the skullbase. Limited evaluation of vertebral artery secondary to venous contamination. Evaluation of the cavernous segment of the internal carotid arteries is limited secondary to venous contamination. No posterior communicating artery aneurysm is noted. If further delineation is clinically desired at current time, MR angiogram of the circle Willis may be considered. 1.1 mm bulge at the origin of the left superior cerebellar artery (possibly congenital). Stability of this can be confirmed on follow-up MR angiogram in 6 months. No intracranial mass, CT evidence of large acute infarct or intracranial hemorrhage. No primary orbital abnormality detected. 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 Heterogeneous partially  calcified thyroid gland with dominant lesion on the left measuring up to 2 cm. Thyroid ultrasound recommended for further delineation. These results will be called to the ordering clinician or representative by the Radiologist Assistant, and communication documented in the PACS or zVision Dashboard. Electronically Signed: By: Genia Del M.D. On: 10/23/2015 16:48   Ct Angio Neck W/cm &/or Wo/cm  10/24/2015  ADDENDUM REPORT: 10/24/2015 13:12 ADDENDUM: Per discussion with Dr. Everlene Farrier, there is slight infiltration of fat planes in the right preseptal region and anterior to the right lateral orbital wall suggesting preseptal inflammation. No postseptal extension. Electronically Signed   By: Genia Del M.D.   On: 10/24/2015 13:12  10/24/2015  CLINICAL DATA:  60 year old female with right ptosis. Initial encounter. EXAM: CT ANGIOGRAPHY HEAD AND NECK TECHNIQUE: Multidetector CT imaging of the head and neck was performed using the standard protocol during bolus administration of intravenous contrast. Multiplanar CT image reconstructions and MIPs were obtained to evaluate the vascular anatomy. Carotid stenosis measurements (when applicable) are obtained utilizing NASCET criteria, using the distal internal carotid diameter as the denominator. CONTRAST:  139m OMNIPAQUE IOHEXOL 350 MG/ML SOLN COMPARISON:  None. FINDINGS: CT HEAD Brain: No intracranial hemorrhage or CT evidence of large acute infarct. No intracranial mass or abnormal enhancement. No hydrocephalus. Calvarium and skull base: Negative. Paranasal sinuses: Clear. Orbits: Symmetric normal appearance of orbital structures including extra-ocular muscles. Symmetric normal appearance of superior ophthalmic veins and cavernous sinus. CTA NECK Aortic arch: 3 vessel aortic arch. Right carotid system: No evidence of dissection. Left carotid system: No evidence of dissection. Vertebral arteries:Evaluation limited secondary to phase of contrast injection. 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 Other neck: Heterogeneous partially calcified thyroid gland with dominant lesion on the left measuring up to 2 cm. Thyroid ultrasound recommended for further delineation. CTA HEAD Anterior circulation: Evaluation limited by phase of contrast injection. No posterior communicating artery aneurysm noted. Limited evaluation cavernous segment of the internal carotid artery secondary to venous contamination. Posterior circulation: Right vertebral artery is small after takeoff of the right posterior inferior cerebellar artery. Ectatic vertebral arteries and basilar artery. Tiny bulge origin of the left superior cerebellar artery (1.1 mm). This may be related to congenital bulge secondary to fetal type  origin of the left posterior cerebral artery. Stability can be confirmed on follow-up MR angiogram in 6 months. Venous sinuses: Negative. Anatomic variants: As above. Delayed phase: As above. IMPRESSION: Present exam was performed in a delayed fashion secondary to interruption of the scanner toward injection. This limits evaluation particularly evaluation of intracranial vasculature. No evidence of dissection of the internal carotid artery from the bifurcation to the skullbase. Limited evaluation of vertebral artery secondary to venous contamination. Evaluation of the cavernous segment of the internal carotid arteries is limited secondary to venous contamination. No posterior communicating artery aneurysm is noted. If further delineation is clinically desired at current time, MR angiogram of the circle Willis may be considered. 1.1 mm bulge at the origin of the left superior cerebellar artery (possibly congenital). Stability of this can be confirmed on follow-up MR angiogram in 6 months. No intracranial mass, CT evidence of large acute infarct or intracranial hemorrhage. No primary orbital  abnormality detected. 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 Heterogeneous partially calcified thyroid gland with dominant lesion on the left measuring up to 2 cm. Thyroid ultrasound recommended for further delineation. These results will be called to the ordering clinician or representative by the Radiologist Assistant, and communication documented in the PACS or zVision Dashboard. Electronically Signed: By: Genia Del M.D. On: 10/23/2015 16:48   Ct Orbits W/cm  10/24/2015  ADDENDUM REPORT: 10/24/2015 17:30 ADDENDUM: Patient to receive antibiotics per phone conversation with Dr. Everlene Farrier. If the right lacrimal gland region fullness does not clear with antibiotics then other considerations such that primary lacrimal gland abnormality (lymphoma, pseudotumor or other inflammatory process) may then be considered. Electronically Signed   By: Genia Del M.D.   On: 10/24/2015 17:30  10/24/2015  CLINICAL DATA:  60 year old female with episode of right sided ptosis which became painful overnight. Subsequent encounter. EXAM: CT ORBITS WITH CONTRAST TECHNIQUE: Multidetector CT imaging of the orbits was performed following the bolus administration of intravenous contrast. CONTRAST:  69m OMNIPAQUE IOHEXOL 300 MG/ML  SOLN COMPARISON:  CT angiogram 10/23/2015 FINDINGS: More conspicuous and progressive since prior CT angiogram is right preseptal inflammation consistent with preseptal cellulitis. Additionally, in the anterior superior lateral aspect of the right orbit is inflammatory process spanning over 1.3 x 0.4 x 1.4 cm. The lacrimal gland is inseparable from this region and it is possible this represents inflamed lacrimal gland versus abscess related to preseptal cellulitis with posterior displacement of the lacrimal gland. This insinuates between the superior rectus and lateral rectus muscle. This is immediately adjacent  to the roof of the orbit. The preseptal inflammation extends toward seen nose. Mild inflammation of right lacrimal duct. Slightly prominent vein within the superior orbit without thrombosis may be reactive in origin. There is symmetric appearance of the cavernous sinus region with slight underfilling which may be related to phase of imaging rather than thrombosis. Per discussion with Dr. DDanielle Rankin patient does not have cranial nerve symptoms, temperature, white count were elevated sed rate. If the patient developed cranial nerve symptoms then cavernous sinus thrombosis from a retrograde spread of infection may need to be considered. On the current examination, the tiny bulge involving the left basilar tip is origin of a vessel rather than aneurysm. Mastoid air cells, middle ear cavities and paranasal sinuses are clear. IMPRESSION: More conspicuous and progressive since prior CT angiogram is right preseptal inflammation consistent with preseptal cellulitis. Additionally, in the anterior superior lateral aspect of the  right orbit is inflammatory process spanning over 1.3 x 0.4 x 1.4 cm. The lacrimal gland is inseparable from this region and it is possible this represents inflamed lacrimal gland versus abscess related to preseptal cellulitis with posterior displacement of the lacrimal gland. This insinuates between the superior rectus and lateral rectus muscle. This is immediately adjacent to the roof of the orbit. The preseptal inflammation extends toward seen nose. Mild inflammation of right lacrimal duct. Slightly prominent vein within the superior orbit without thrombosis may be reactive in origin. There is symmetric appearance of the cavernous sinus region with underfilling related to phase of imaging rather than septic thrombosis. If the patient developed cranial nerve symptoms then cavernous sinus thrombosis from a retrograde spread of infection may need to be considered. On the current examination, the tiny bulge  involving the left basilar tip is origin of a vessel rather than aneurysm. These results were called by telephone at the time of interpretation on 10/24/2015 at 2:00 pm to Dr. Arlyss Queen , who verbally acknowledged these results. Electronically Signed: By: Genia Del M.D. On: 10/24/2015 15:03    Assessment/Plan 60 year old lady presenting with acute right orbital cellulitis with eye pain and swelling. Patient has no clinical signs of sinus thrombosis, with no signs of dysfunction of cranial nerves III, IV and VI, nor indications of orbital venous. congestion.  Recommendations: 1. Continue management with parenteral antibiotics as planned 2. MRI of the orbits without and with contrast, and MR venogram of the brain have been ordered 3. No further logical intervention is indicated if MRV is unremarkable, and patient does not develop a cranial nerve palsy.  C.R. Nicole Kindred, MD [facility] [phone]  10/24/2015, 10:49 PM

## 2015-10-24 NOTE — ED Notes (Signed)
Dr. Everlene Farrier [phone] Has written the pt. rx for antibiotic and he states for our EDP to feel free to call him with questions/findings.  He further tells [place] he has made pt. An appointment with Dr. Patrice Paradise at [facility] [phone] for 0800 hours tomorrow 10-25-2015.

## 2015-10-24 NOTE — ED Notes (Signed)
She c/o inner right eye area pain which began this Wed.  She saw her doctor for same--no meds prescribed.  Got worse, so she was sent here today for CT elucidation after which she was advised to come to ED for physician eval.  Her left eye is normal in appearance--right upper eye lid is only able to open about 1/2 way, otherwise normal in appearance.  She denies fever, nor any other sign of current illness.  She is alert and oriented x 4 with clear speech.

## 2015-10-24 NOTE — Progress Notes (Addendum)
By signing my name below, I, [name], attest that this documentation has been prepared under the direction and in the presence of Nena Jordan, MD. Electronically Signed: [name], ER Scribe. 10/24/2015. 11:19 AM.  Chief Complaint:  Chief Complaint  Patient presents with  . Eye Pain    recheck getting worse    HPI: Emily Brandt is a 60 y.o. female who reports to [facility] today complaining of continued swelling, redness and aching in the tissue around her right eye. She denies vision changes. She wears glasses, but does not wear contacts. She reported to [facility] yesterday for the same sx, blood work was done and was sent for a CT Angiogram of the neck and head. She denies recent fever chills or fatigue. She has returned today because her eye sx have worsened. Her right lid and the skin around her eye continues to be swollen and slightly red. She feels like her tear ducts are clogged. She had a slight pain in her eye at the end of December, but it resolved after warm compresses. She has had a number of eye infections in the past, which she states she started having in 2002.   She had a pet sitting business, but is now retired.   Past Medical History  Diagnosis Date  . Abnormal finding on Pap smear     Cervicitis  . Endometrial polyp   . Herpes progenitalis   . MVP (mitral valve prolapse)     antibiotic for dental procedures  . Gallstones   . GERD (gastroesophageal reflux disease)   . Arthritis   . Elevated LFTs    Past Surgical History  Procedure Laterality Date  . Hysteroscopy    . Dilation and curettage of uterus    . Nasal septum surgery    . Tonsillectomy    . Cervical fusion    . Shoulder surgery    . Colposcopy    . Nasal septum surgery  1974  . Uterine fibroid surgery  2005  . Tonsillectomy  1965  . Laparoscopic cholecystectomy  09/03/2012   Social History   Social History  . Marital Status: Married    Spouse Name: N/A  . Number of Children: N/A  .  Years of Education: N/A   Social History Main Topics  . Smoking status: Never Smoker   . Smokeless tobacco: Never Used  . Alcohol Use: 0.0 oz/week    1-2 Glasses of wine per week     Comment: occas  . Drug Use: No  . Sexual Activity: Not Currently    Birth Control/ Protection: Post-menopausal   Other Topics Concern  . None   Social History Narrative   Family History  Problem Relation Age of Onset  . Adopted: Yes  . Colon cancer Mother   . Hypertension Mother   . Osteoporosis Mother   . Cancer Mother   . Heart disease Sister   . Diabetes Maternal Grandmother   . Heart disease Maternal Grandfather   . Esophageal cancer Neg Hx   . Rectal cancer Neg Hx   . Stomach cancer Neg Hx    Allergies  Allergen Reactions  . Sulfa Antibiotics Swelling   Prior to Admission medications   Medication Sig Start Date End Date Taking? Authorizing Provider  acyclovir (ZOVIRAX) 400 MG tablet Take 1 tablet (400 mg total) by mouth daily. 07/27/15  Yes Terrance Mass, MD  BIOTIN PO Take 1 tablet by mouth daily.   Yes Historical Provider,  MD  CALCIUM PO Take by mouth daily.   Yes Historical Provider, MD  Coenzyme Q10 (CO Q 10 PO) Take 1 tablet by mouth daily.   Yes Historical Provider, MD  fish oil-omega-3 fatty acids 1000 MG capsule Take 1 g by mouth daily.     Yes Historical Provider, MD  GABAPENTIN PO Take by mouth.     Yes Historical Provider, MD  Multiple Vitamin (MULTIVITAMIN) tablet Take 1 tablet by mouth daily.     Yes Historical Provider, MD  NON FORMULARY Primrose oil daily   Yes Historical Provider, MD  NORTRIPTYLINE HCL PO Take by mouth.     Yes Historical Provider, MD     ROS: The patient denies fevers, chills, night sweats, unintentional weight loss, chest pain, palpitations, wheezing, dyspnea on exertion, nausea, vomiting, abdominal pain, dysuria, hematuria, melena, numbness, weakness, or tingling.   All other systems have been reviewed and were otherwise negative with the  exception of those mentioned in the HPI and as above.    PHYSICAL EXAM: Filed Vitals:   10/24/15 1028  BP: 124/60  Pulse: 86  Temp: 98.2 F (36.8 C)  Resp: 18   Body mass index is 26.54 kg/(m^2).   General: Alert, no acute distress HEENT:  Normocephalic, atraumatic, oropharynx patent.   She is complaining of pain around her right eye upper lid ptosis noted. Soft tissue swelling noted laterally and inferiorly no pain with movement of the right eye. Florocine stain had no uptake and lid eversion is unremarkable.   Eye: [name] [facility] Cardiovascular:  Regular rate and rhythm, no rubs murmurs or gallops.  No Carotid bruits, radial pulse intact. No pedal edema.  Respiratory: Clear to auscultation bilaterally.  No wheezes, rales, or rhonchi.  No cyanosis, no use of accessory musculature Abdominal: No organomegaly, abdomen is soft and non-tender, positive bowel sounds.  No masses. Musculoskeletal: Gait intact. No edema, tenderness Skin: No rashes. Neurologic: Facial musculature symmetric. Psychiatric: Patient acts appropriately throughout our interaction. Lymphatic: No cervical or submandibular lymphadenopathy    LABS: Results for orders placed or performed in visit on 10/24/15  POCT CBC  Result Value Ref Range   WBC 5.6 4.6 - 10.2 K/uL   Lymph, poc 1.4 0.6 - 3.4   POC LYMPH PERCENT 24.7 10 - 50 %L   MID (cbc) 0.4 0 - 0.9   POC MID % 7.1 0 - 12 %M   POC Granulocyte 3.8 2 - 6.9   Granulocyte percent 68.2 37 - 80 %G   RBC 4.71 4.04 - 5.48 M/uL   Hemoglobin 14.3 12.2 - 16.2 g/dL   HCT, POC 42.1 37.7 - 47.9 %   MCV 89.4 80 - 97 fL   MCH, POC 30.4 27 - 31.2 pg   MCHC 34.0 31.8 - 35.4 g/dL   RDW, POC 12.4 %   Platelet Count, POC 212 142 - 424 K/uL   MPV 8.5 0 - 99.8 fL    EKG/XRAY:   Primary read interpreted by Dr. Everlene Farrier at [facility].   ASSESSMENT/PLAN:  CTA of head and neck showed no evidence of carotid dissection. There was a 1.1 mm bulge in the left superior cerebellar  artery. There was a partially calcified thyroid gland with a 2cm lesion on the left. She has a history of herpes and is on acyclovir as preventative . I discussed her case with Dr. Jeannine Kitten. She subsequently had a CT of the right orbit. The CT of the orbit is worrisome for preseptal  inflammation right  side of the orbit. Case was discussed with Dr. Jeannine Kitten the radiologist as well as Dr. Patrice Paradise : Eye specialist. Patient was sent to the emergency room for their reevaluation. Doctor Patrice Paradise : Is available to see the patient at 8 AM in the morning. Consideration to be given to IV antibiotics and admission.... I left my phone number with the charge nurse [phone] also gave them Dr. Lorre Nick cell phone number. I personally performed the services described in this documentation, which was scribed in my presence.  Gross sideeffects, risk and benefits, and alternatives of medications d/w patient. Patient is aware that all medications have potential sideeffects and we are unable to predict every sideeffect or drug-drug interaction that may occur.  Arlyss Queen MD 10/24/2015 11:19 AM

## 2015-10-24 NOTE — ED Notes (Signed)
Went to do ekg MD is in with PT

## 2015-10-24 NOTE — H&P (Addendum)
[facility] History and Physical  Emily Brandt [id number] DOB: August 20, 1956 DOA: 10/24/2015  Referring physician: Dr Emily Brandt, PCP: No PCP Per Patient   Chief Complaint: worsening right  Swelling pain.  HPI: Emily Brandt is a 60 y.o. female with PMH of MVP, Cervical Spine fusion who presents today with worsening pain, swelling of right eye. She started to have some discomfort and swelling of right eye 4 days prior to admission. She went to urgent care on the 21 and a CT head and neck was done which was read normal. She return 1-22 to urgent care with persistent and worsening symptoms. A ct orbit was done which showed preseptal cellulitis and question of cavernous sinus thrombosis. Also the CT orbit  reveal  in the anterior superior lateral aspect of the right orbit is inflammatory process spanning over 1.3 x 0.4 x 1.4 cm. The lacrimal gland is inseparable from this region and it is possible this represents inflamed lacrimal gland versus abscess related to preseptal cellulitis with posterior displacement of the lacrimal gland.  Patient denies fever, headaches, visual changes, mild pressure of right eye with movement.  Other evalutaion in the ED; lactic acid 1.5, normal WBC and electrolytes.    Review of Systems:  Negative, except as per HPI.   Past Medical History  Diagnosis Date  . Abnormal finding on Pap smear     Cervicitis  . Endometrial polyp   . Herpes progenitalis   . MVP (mitral valve prolapse)     antibiotic for dental procedures  . Gallstones   . GERD (gastroesophageal reflux disease)   . Arthritis   . Elevated LFTs    Past Surgical History  Procedure Laterality Date  . Hysteroscopy    . Dilation and curettage of uterus    . Nasal septum surgery    . Tonsillectomy    . Cervical fusion    . Shoulder surgery    . Colposcopy    . Nasal septum surgery  1974  . Uterine fibroid surgery  2005  . Tonsillectomy  1965  . Laparoscopic cholecystectomy   09/03/2012   Social History:  reports that she has never smoked. She has never used smokeless tobacco. She reports that she drinks alcohol. She reports that she does not use illicit drugs.  Allergies  Allergen Reactions  . Sulfa Antibiotics Swelling    Family History  Problem Relation Age of Onset  . Adopted: Yes  . Colon cancer Mother   . Hypertension Mother   . Osteoporosis Mother   . Cancer Mother   . Heart disease Sister   . Diabetes Maternal Grandmother   . Heart disease Maternal Grandfather   . Esophageal cancer Neg Hx   . Rectal cancer Neg Hx   . Stomach cancer Neg Hx     Prior to Admission medications   Medication Sig Start Date End Date Taking? Authorizing Provider  acyclovir (ZOVIRAX) 400 MG tablet Take 1 tablet (400 mg total) by mouth daily. 07/27/15  Yes Terrance Mass, MD  BIOTIN PO Take 1 tablet by mouth daily.   Yes Historical Provider, MD  CALCIUM PO Take 1 tablet by mouth daily.    Yes Historical Provider, MD  Coenzyme Q10 (CO Q 10 PO) Take 1 tablet by mouth daily.   Yes Historical Provider, MD  fish oil-omega-3 fatty acids 1000 MG capsule Take 1 g by mouth daily.     Yes Historical Provider, MD  gabapentin (NEURONTIN) 400 MG capsule Take 400 mg  by mouth 3 (three) times daily. 09/15/15  Yes Historical Provider, MD  Ginger, Zingiber officinalis, (GINGER ROOT PO) Take 1 capsule by mouth daily.   Yes Historical Provider, MD  Multiple Vitamin (MULTIVITAMIN) tablet Take 1 tablet by mouth daily.     Yes Historical Provider, MD  NON FORMULARY Take 1 capsule by mouth daily. Primrose oil daily   Yes Historical Provider, MD  nortriptyline (PAMELOR) 50 MG capsule TAKE 1 CAPSULE (50 MG TOTAL) BY MOUTH AT BEDTIME 09/15/15  Yes Historical Provider, MD  TURMERIC PO Take 1 capsule by mouth daily.   Yes Historical Provider, MD  amoxicillin-clavulanate (AUGMENTIN) 875-125 MG tablet Take 1 tablet by mouth 2 (two) times daily. 10/24/15   Emily Russian, MD   Physical Exam: Filed  Vitals:   10/24/15 1447 10/24/15 1734  BP: 169/89 147/82  Pulse: 102 89  Temp: 98.4 F (36.9 C)   TempSrc: Oral   Resp: 18 16  SpO2: 98% 100%    Wt Readings from Last 3 Encounters:  10/24/15 73.483 kg (162 lb)  10/23/15 74.39 kg (164 lb)  07/27/15 71.668 kg (158 lb)    General:  Appears calm and comfortable Eyes: PERRL, normal lids, irises & conjunctiva, right eye with topsis, mild swelling no significant redness.  ENT: grossly normal hearing, lips & tongue Neck: no LAD, masses or thyromegaly Cardiovascular: RRR, no m/r/g. No LE edema. Telemetry: SR, no arrhythmias  Respiratory: CTA bilaterally, no w/r/r. Normal respiratory effort. Abdomen: soft, ntnd Skin: no rash or induration seen on limited exam Musculoskeletal: grossly normal tone BUE/BLE Psychiatric: grossly normal mood and affect, speech fluent and appropriate Neurologic: grossly non-focal. Right eye with topsis no loss of vision,           Labs on Admission:  Basic Metabolic Panel:  Recent Labs Lab 10/24/15 1344 10/24/15 1703  NA  --  141  K  --  4.4  CL  --  104  CO2  --  24  GLUCOSE  --  94  BUN  --  14  CREATININE 0.90 0.72  CALCIUM  --  9.9   Liver Function Tests:  Recent Labs Lab 10/24/15 1703  AST 35  ALT 35  ALKPHOS 66  BILITOT 0.7  PROT 7.4  ALBUMIN 4.5   No results for input(s): LIPASE, AMYLASE in the last 168 hours. No results for input(s): AMMONIA in the last 168 hours. CBC:  Recent Labs Lab 10/24/15 1130 10/24/15 1703  WBC 5.6 6.8  NEUTROABS  --  4.6  HGB 14.3 14.6  HCT 42.1 44.7  MCV 89.4 93.9  PLT  --  228   Cardiac Enzymes: No results for input(s): CKTOTAL, CKMB, CKMBINDEX, TROPONINI in the last 168 hours.  BNP (last 3 results) No results for input(s): BNP in the last 8760 hours.  ProBNP (last 3 results) No results for input(s): PROBNP in the last 8760 hours.  CBG: No results for input(s): GLUCAP in the last 168 hours.  Radiological Exams on Admission: Ct  Angio Head W/cm &/or Wo Cm  10/24/2015  ADDENDUM REPORT: 10/24/2015 13:12 ADDENDUM: Per discussion with Dr. Everlene Farrier, there is slight infiltration of fat planes in the right preseptal region and anterior to the right lateral orbital wall suggesting preseptal inflammation. No postseptal extension. Electronically Signed   By: Genia Del M.D.   On: 10/24/2015 13:12  10/24/2015  CLINICAL DATA:  60 year old female with right ptosis. Initial encounter. EXAM: CT ANGIOGRAPHY HEAD AND NECK TECHNIQUE: Multidetector CT imaging of  the head and neck was performed using the standard protocol during bolus administration of intravenous contrast. Multiplanar CT image reconstructions and MIPs were obtained to evaluate the vascular anatomy. Carotid stenosis measurements (when applicable) are obtained utilizing NASCET criteria, using the distal internal carotid diameter as the denominator. CONTRAST:  139mL OMNIPAQUE IOHEXOL 350 MG/ML SOLN COMPARISON:  None. FINDINGS: CT HEAD Brain: No intracranial hemorrhage or CT evidence of large acute infarct. No intracranial mass or abnormal enhancement. No hydrocephalus. Calvarium and skull base: Negative. Paranasal sinuses: Clear. Orbits: Symmetric normal appearance of orbital structures including extra-ocular muscles. Symmetric normal appearance of superior ophthalmic veins and cavernous sinus. CTA NECK Aortic arch: 3 vessel aortic arch. Right carotid system: No evidence of dissection. Left carotid system: No evidence of dissection. Vertebral arteries:Evaluation limited secondary to phase of contrast injection. 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 Other neck: Heterogeneous partially calcified thyroid gland with dominant lesion on the left measuring up to 2 cm. Thyroid ultrasound recommended for further delineation. CTA HEAD Anterior circulation: Evaluation limited by phase of contrast  injection. No posterior communicating artery aneurysm noted. Limited evaluation cavernous segment of the internal carotid artery secondary to venous contamination. Posterior circulation: Right vertebral artery is small after takeoff of the right posterior inferior cerebellar artery. Ectatic vertebral arteries and basilar artery. Tiny bulge origin of the left superior cerebellar artery (1.1 mm). This may be related to congenital bulge secondary to fetal type origin of the left posterior cerebral artery. Stability can be confirmed on follow-up MR angiogram in 6 months. Venous sinuses: Negative. Anatomic variants: As above. Delayed phase: As above. IMPRESSION: Present exam was performed in a delayed fashion secondary to interruption of the scanner toward injection. This limits evaluation particularly evaluation of intracranial vasculature. No evidence of dissection of the internal carotid artery from the bifurcation to the skullbase. Limited evaluation of vertebral artery secondary to venous contamination. Evaluation of the cavernous segment of the internal carotid arteries is limited secondary to venous contamination. No posterior communicating artery aneurysm is noted. If further delineation is clinically desired at current time, MR angiogram of the circle Willis may be considered. 1.1 mm bulge at the origin of the left superior cerebellar artery (possibly congenital). Stability of this can be confirmed on follow-up MR angiogram in 6 months. No intracranial mass, CT evidence of large acute infarct or intracranial hemorrhage. No primary orbital abnormality detected. 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 Heterogeneous partially calcified thyroid gland with dominant lesion on the left measuring up to 2 cm. Thyroid ultrasound recommended for further delineation. These results will be called to the ordering clinician or  representative by the Radiologist Assistant, and communication documented in the PACS or zVision Dashboard. Electronically Signed: By: Genia Del M.D. On: 10/23/2015 16:48   Ct Angio Neck W/cm &/or Wo/cm  10/24/2015  ADDENDUM REPORT: 10/24/2015 13:12 ADDENDUM: Per discussion with Dr. Everlene Farrier, there is slight infiltration of fat planes in the right preseptal region and anterior to the right lateral orbital wall suggesting preseptal inflammation. No postseptal extension. Electronically Signed   By: Genia Del M.D.   On: 10/24/2015 13:12  10/24/2015  CLINICAL DATA:  60 year old female with right ptosis. Initial encounter. EXAM: CT ANGIOGRAPHY HEAD AND NECK TECHNIQUE: Multidetector CT imaging of the head and neck was performed using the standard protocol during bolus administration of intravenous contrast. Multiplanar  CT image reconstructions and MIPs were obtained to evaluate the vascular anatomy. Carotid stenosis measurements (when applicable) are obtained utilizing NASCET criteria, using the distal internal carotid diameter as the denominator. CONTRAST:  155mL OMNIPAQUE IOHEXOL 350 MG/ML SOLN COMPARISON:  None. FINDINGS: CT HEAD Brain: No intracranial hemorrhage or CT evidence of large acute infarct. No intracranial mass or abnormal enhancement. No hydrocephalus. Calvarium and skull base: Negative. Paranasal sinuses: Clear. Orbits: Symmetric normal appearance of orbital structures including extra-ocular muscles. Symmetric normal appearance of superior ophthalmic veins and cavernous sinus. CTA NECK Aortic arch: 3 vessel aortic arch. Right carotid system: No evidence of dissection. Left carotid system: No evidence of dissection. Vertebral arteries:Evaluation limited secondary to phase of contrast injection. 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 Other neck: Heterogeneous partially calcified thyroid gland  with dominant lesion on the left measuring up to 2 cm. Thyroid ultrasound recommended for further delineation. CTA HEAD Anterior circulation: Evaluation limited by phase of contrast injection. No posterior communicating artery aneurysm noted. Limited evaluation cavernous segment of the internal carotid artery secondary to venous contamination. Posterior circulation: Right vertebral artery is small after takeoff of the right posterior inferior cerebellar artery. Ectatic vertebral arteries and basilar artery. Tiny bulge origin of the left superior cerebellar artery (1.1 mm). This may be related to congenital bulge secondary to fetal type origin of the left posterior cerebral artery. Stability can be confirmed on follow-up MR angiogram in 6 months. Venous sinuses: Negative. Anatomic variants: As above. Delayed phase: As above. IMPRESSION: Present exam was performed in a delayed fashion secondary to interruption of the scanner toward injection. This limits evaluation particularly evaluation of intracranial vasculature. No evidence of dissection of the internal carotid artery from the bifurcation to the skullbase. Limited evaluation of vertebral artery secondary to venous contamination. Evaluation of the cavernous segment of the internal carotid arteries is limited secondary to venous contamination. No posterior communicating artery aneurysm is noted. If further delineation is clinically desired at current time, MR angiogram of the circle Willis may be considered. 1.1 mm bulge at the origin of the left superior cerebellar artery (possibly congenital). Stability of this can be confirmed on follow-up MR angiogram in 6 months. No intracranial mass, CT evidence of large acute infarct or intracranial hemorrhage. No primary orbital abnormality detected. 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 Heterogeneous partially calcified  thyroid gland with dominant lesion on the left measuring up to 2 cm. Thyroid ultrasound recommended for further delineation. These results will be called to the ordering clinician or representative by the Radiologist Assistant, and communication documented in the PACS or zVision Dashboard. Electronically Signed: By: Genia Del M.D. On: 10/23/2015 16:48   Ct Orbits W/cm  10/24/2015  ADDENDUM REPORT: 10/24/2015 17:30 ADDENDUM: Patient to receive antibiotics per phone conversation with Dr. Everlene Farrier. If the right lacrimal gland region fullness does not clear with antibiotics then other considerations such that primary lacrimal gland abnormality (lymphoma, pseudotumor or other inflammatory process) may then be considered. Electronically Signed   By: Genia Del M.D.   On: 10/24/2015 17:30  10/24/2015  CLINICAL DATA:  60 year old female with episode of right sided ptosis which became painful overnight. Subsequent encounter. EXAM: CT ORBITS WITH CONTRAST TECHNIQUE: Multidetector CT imaging of the orbits was performed following the bolus administration of intravenous contrast. CONTRAST:  29mL OMNIPAQUE IOHEXOL 300 MG/ML  SOLN COMPARISON:  CT angiogram 10/23/2015 FINDINGS: More conspicuous and progressive since prior CT angiogram is right preseptal inflammation consistent with preseptal cellulitis. Additionally, in the anterior superior lateral aspect of the right orbit is inflammatory process spanning over 1.3 x 0.4 x 1.4 cm. The lacrimal gland is inseparable from this region and it is possible this represents inflamed lacrimal gland versus abscess related to preseptal cellulitis with posterior displacement of the lacrimal gland. This insinuates between the superior rectus and lateral rectus muscle. This is immediately adjacent to the roof of the orbit. The preseptal inflammation extends toward seen nose. Mild inflammation of right lacrimal duct. Slightly prominent vein within the superior orbit without thrombosis may  be reactive in origin. There is symmetric appearance of the cavernous sinus region with slight underfilling which may be related to phase of imaging rather than thrombosis. Per discussion with Dr. Danielle Rankin, patient does not have cranial nerve symptoms, temperature, white count were elevated sed rate. If the patient developed cranial nerve symptoms then cavernous sinus thrombosis from a retrograde spread of infection may need to be considered. On the current examination, the tiny bulge involving the left basilar tip is origin of a vessel rather than aneurysm. Mastoid air cells, middle ear cavities and paranasal sinuses are clear. IMPRESSION: More conspicuous and progressive since prior CT angiogram is right preseptal inflammation consistent with preseptal cellulitis. Additionally, in the anterior superior lateral aspect of the right orbit is inflammatory process spanning over 1.3 x 0.4 x 1.4 cm. The lacrimal gland is inseparable from this region and it is possible this represents inflamed lacrimal gland versus abscess related to preseptal cellulitis with posterior displacement of the lacrimal gland. This insinuates between the superior rectus and lateral rectus muscle. This is immediately adjacent to the roof of the orbit. The preseptal inflammation extends toward seen nose. Mild inflammation of right lacrimal duct. Slightly prominent vein within the superior orbit without thrombosis may be reactive in origin. There is symmetric appearance of the cavernous sinus region with underfilling related to phase of imaging rather than septic thrombosis. If the patient developed cranial nerve symptoms then cavernous sinus thrombosis from a retrograde spread of infection may need to be considered. On the current examination, the tiny bulge involving the left basilar tip is origin of a vessel rather than aneurysm. These results were called by telephone at the time of interpretation on 10/24/2015 at 2:00 pm to Dr. Arlyss Queen , who  verbally acknowledged these results. Electronically Signed: By: Genia Del M.D. On: 10/24/2015 15:03    EKG: will order EKG  Assessment/Plan Active Problems:   MVP (mitral valve prolapse)   Cavernous sinus thrombosis   Preseptal cellulitis  1-Right possible preseptal cellulitis,Question of cavernous sinus thrombosis:  IV antibiotics, Vancomycin and Zosyn ordered.  Opthalmology consulted.  Also neurology was consulted by ED physician and is requesting for patient to be transfer to [facility].  Will defer to neurology steroids and or anticoagulation.   2-Inflame lacrimal gland, inlammatory process right orbit ? Abscess: opthalmology consulted.   3-History of spinal fusion; continue with gabapentin  Heterogeneous partially calcified thyroid gland with dominant lesion on the left measuring up to 2 cm. Patient will need  Thyroid ultrasound.    Code Status: Presume Full Code.  DVT Prophylaxis: lovenox.  Family Communication: Husband was at bedside.  Disposition Plan: Expect 2 to 4 days inpatient depending on workup   Time spent: [place], [facility] Pager [phone]

## 2015-10-24 NOTE — Progress Notes (Signed)
On-call neurologist C Stewart notified of pt's arrival. Thanks

## 2015-10-24 NOTE — ED Provider Notes (Addendum)
CSN: [id number]     Arrival date & time 10/24/15  1435 History   First MD Initiated Contact with Patient 10/24/15 1536     Chief Complaint  Patient presents with  . Eye Problem     (Consider location/radiation/quality/duration/timing/severity/associated sxs/prior Treatment) HPI   Patient is a very pleasant 60 year old female presenting with eye pain. Patient's had eye pain started on Wednesday. No fevers. Patient states she had to 3 days of pain and then went to urgent care. They did a CAT scan yesterday. It was read as normal. She will back today for worsening symptoms. They ordered a dedicated orbit CAT scan.  Results of CAT scan showed preseptal cellulitis with questionable cavernous sinus thrombosis.  Patient has no fever here, mild tachycardia. No systemic symptoms.  Patient has normal vision. Does not complain of any perceived weakness.  Past Medical History  Diagnosis Date  . Abnormal finding on Pap smear     Cervicitis  . Endometrial polyp   . Herpes progenitalis   . MVP (mitral valve prolapse)     antibiotic for dental procedures  . Gallstones   . GERD (gastroesophageal reflux disease)   . Arthritis   . Elevated LFTs    Past Surgical History  Procedure Laterality Date  . Hysteroscopy    . Dilation and curettage of uterus    . Nasal septum surgery    . Tonsillectomy    . Cervical fusion    . Shoulder surgery    . Colposcopy    . Nasal septum surgery  1974  . Uterine fibroid surgery  2005  . Tonsillectomy  1965  . Laparoscopic cholecystectomy  09/03/2012   Family History  Problem Relation Age of Onset  . Adopted: Yes  . Colon cancer Mother   . Hypertension Mother   . Osteoporosis Mother   . Cancer Mother   . Heart disease Sister   . Diabetes Maternal Grandmother   . Heart disease Maternal Grandfather   . Esophageal cancer Neg Hx   . Rectal cancer Neg Hx   . Stomach cancer Neg Hx    Social History  Substance Use Topics  . Smoking status: Never  Smoker   . Smokeless tobacco: Never Used  . Alcohol Use: 0.0 oz/week    1-2 Glasses of wine per week     Comment: occas   OB History    Gravida Para Term Preterm AB TAB SAB Ectopic Multiple Living   1    1  1    0     Review of Systems  Constitutional: Negative for fever, activity change and fatigue.  HENT: Negative for congestion.   Respiratory: Negative for shortness of breath.   Cardiovascular: Negative for chest pain.  Gastrointestinal: Negative for abdominal pain.  Genitourinary: Negative for dysuria.  Psychiatric/Behavioral: Negative for agitation.      Allergies  Sulfa antibiotics  Home Medications   Prior to Admission medications   Medication Sig Start Date End Date Taking? Authorizing Provider  acyclovir (ZOVIRAX) 400 MG tablet Take 1 tablet (400 mg total) by mouth daily. 07/27/15  Yes Terrance Mass, MD  BIOTIN PO Take 1 tablet by mouth daily.   Yes Historical Provider, MD  CALCIUM PO Take 1 tablet by mouth daily.    Yes Historical Provider, MD  Coenzyme Q10 (CO Q 10 PO) Take 1 tablet by mouth daily.   Yes Historical Provider, MD  fish oil-omega-3 fatty acids 1000 MG capsule Take 1 g by mouth daily.  Yes Historical Provider, MD  gabapentin (NEURONTIN) 400 MG capsule Take 400 mg by mouth 3 (three) times daily. 09/15/15  Yes Historical Provider, MD  Ginger, Zingiber officinalis, (GINGER ROOT PO) Take 1 capsule by mouth daily.   Yes Historical Provider, MD  Multiple Vitamin (MULTIVITAMIN) tablet Take 1 tablet by mouth daily.     Yes Historical Provider, MD  NON FORMULARY Take 1 capsule by mouth daily. Primrose oil daily   Yes Historical Provider, MD  nortriptyline (PAMELOR) 50 MG capsule TAKE 1 CAPSULE (50 MG TOTAL) BY MOUTH AT BEDTIME 09/15/15  Yes Historical Provider, MD  TURMERIC PO Take 1 capsule by mouth daily.   Yes Historical Provider, MD  amoxicillin-clavulanate (AUGMENTIN) 875-125 MG tablet Take 1 tablet by mouth 2 (two) times daily. 10/24/15   Darlyne Russian, MD   BP 169/89 mmHg  Pulse 102  Temp(Src) 98.4 F (36.9 C) (Oral)  Resp 18  SpO2 98% Physical Exam  Constitutional: She is oriented to person, place, and time. She appears well-developed and well-nourished.  HENT:  Head: Normocephalic and atraumatic.  Eyes: Conjunctivae are normal. Right eye exhibits no discharge. Left eye exhibits no discharge.  Neck: Neck supple.  Cardiovascular: Normal rate, regular rhythm and normal heart sounds.   No murmur heard. Pulmonary/Chest: Effort normal and breath sounds normal. She has no wheezes. She has no rales.  Abdominal: Soft. She exhibits no distension. There is no tenderness.  Musculoskeletal: Normal range of motion. She exhibits no edema.  Neurological: She is oriented to person, place, and time. A cranial nerve deficit is present.  Ptosis. Pupils are equal round responsive. No other cranial nerve deficits.  No surrounding erythema of either eye.  Trace edema around right eye.  Skin: Skin is warm and dry. No rash noted. She is not diaphoretic.  Psychiatric: She has a normal mood and affect. Her behavior is normal.  Nursing note and vitals reviewed.   ED Course  Procedures (including critical care time) Labs Review Labs Reviewed  CBC WITH DIFFERENTIAL/PLATELET  COMPREHENSIVE METABOLIC PANEL  I-STAT CG4 LACTIC ACID, ED    Imaging Review Ct Angio Head W/cm &/or Wo Cm  10/24/2015  ADDENDUM REPORT: 10/24/2015 13:12 ADDENDUM: Per discussion with Dr. Everlene Farrier, there is slight infiltration of fat planes in the right preseptal region and anterior to the right lateral orbital wall suggesting preseptal inflammation. No postseptal extension. Electronically Signed   By: Genia Del M.D.   On: 10/24/2015 13:12  10/24/2015  CLINICAL DATA:  60 year old female with right ptosis. Initial encounter. EXAM: CT ANGIOGRAPHY HEAD AND NECK TECHNIQUE: Multidetector CT imaging of the head and neck was performed using the standard protocol during bolus  administration of intravenous contrast. Multiplanar CT image reconstructions and MIPs were obtained to evaluate the vascular anatomy. Carotid stenosis measurements (when applicable) are obtained utilizing NASCET criteria, using the distal internal carotid diameter as the denominator. CONTRAST:  184mL OMNIPAQUE IOHEXOL 350 MG/ML SOLN COMPARISON:  None. FINDINGS: CT HEAD Brain: No intracranial hemorrhage or CT evidence of large acute infarct. No intracranial mass or abnormal enhancement. No hydrocephalus. Calvarium and skull base: Negative. Paranasal sinuses: Clear. Orbits: Symmetric normal appearance of orbital structures including extra-ocular muscles. Symmetric normal appearance of superior ophthalmic veins and cavernous sinus. CTA NECK Aortic arch: 3 vessel aortic arch. Right carotid system: No evidence of dissection. Left carotid system: No evidence of dissection. Vertebral arteries:Evaluation limited secondary to phase of contrast injection. 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 Other neck: Heterogeneous partially calcified thyroid gland with dominant lesion on the left measuring up to 2 cm. Thyroid ultrasound recommended for further delineation. CTA HEAD Anterior circulation: Evaluation limited by phase of contrast injection. No posterior communicating artery aneurysm noted. Limited evaluation cavernous segment of the internal carotid artery secondary to venous contamination. Posterior circulation: Right vertebral artery is small after takeoff of the right posterior inferior cerebellar artery. Ectatic vertebral arteries and basilar artery. Tiny bulge origin of the left superior cerebellar artery (1.1 mm). This may be related to congenital bulge secondary to fetal type origin of the left posterior cerebral artery. Stability can be confirmed on follow-up MR angiogram in 6 months. Venous sinuses:  Negative. Anatomic variants: As above. Delayed phase: As above. IMPRESSION: Present exam was performed in a delayed fashion secondary to interruption of the scanner toward injection. This limits evaluation particularly evaluation of intracranial vasculature. No evidence of dissection of the internal carotid artery from the bifurcation to the skullbase. Limited evaluation of vertebral artery secondary to venous contamination. Evaluation of the cavernous segment of the internal carotid arteries is limited secondary to venous contamination. No posterior communicating artery aneurysm is noted. If further delineation is clinically desired at current time, MR angiogram of the circle Willis may be considered. 1.1 mm bulge at the origin of the left superior cerebellar artery (possibly congenital). Stability of this can be confirmed on follow-up MR angiogram in 6 months. No intracranial mass, CT evidence of large acute infarct or intracranial hemorrhage. No primary orbital abnormality detected. 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 Heterogeneous partially calcified thyroid gland with dominant lesion on the left measuring up to 2 cm. Thyroid ultrasound recommended for further delineation. These results will be called to the ordering clinician or representative by the Radiologist Assistant, and communication documented in the PACS or zVision Dashboard. Electronically Signed: By: Genia Del M.D. On: 10/23/2015 16:48   Ct Angio Neck W/cm &/or Wo/cm  10/24/2015  ADDENDUM REPORT: 10/24/2015 13:12 ADDENDUM: Per discussion with Dr. Everlene Farrier, there is slight infiltration of fat planes in the right preseptal region and anterior to the right lateral orbital wall suggesting preseptal inflammation. No postseptal extension. Electronically Signed   By: Genia Del M.D.   On: 10/24/2015 13:12  10/24/2015  CLINICAL DATA:  60 year old  female with right ptosis. Initial encounter. EXAM: CT ANGIOGRAPHY HEAD AND NECK TECHNIQUE: Multidetector CT imaging of the head and neck was performed using the standard protocol during bolus administration of intravenous contrast. Multiplanar CT image reconstructions and MIPs were obtained to evaluate the vascular anatomy. Carotid stenosis measurements (when applicable) are obtained utilizing NASCET criteria, using the distal internal carotid diameter as the denominator. CONTRAST:  125mL OMNIPAQUE IOHEXOL 350 MG/ML SOLN COMPARISON:  None. FINDINGS: CT HEAD Brain: No intracranial hemorrhage or CT evidence of large acute infarct. No intracranial mass or abnormal enhancement. No hydrocephalus. Calvarium and skull base: Negative. Paranasal sinuses: Clear. Orbits: Symmetric normal appearance of orbital structures including extra-ocular muscles. Symmetric normal appearance of superior ophthalmic veins and cavernous sinus. CTA NECK Aortic arch: 3 vessel aortic arch. Right carotid system: No evidence of dissection. Left carotid system: No evidence of dissection. Vertebral arteries:Evaluation limited secondary to phase of contrast injection. 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 Other neck: Heterogeneous partially calcified thyroid gland with dominant  lesion on the left measuring up to 2 cm. Thyroid ultrasound recommended for further delineation. CTA HEAD Anterior circulation: Evaluation limited by phase of contrast injection. No posterior communicating artery aneurysm noted. Limited evaluation cavernous segment of the internal carotid artery secondary to venous contamination. Posterior circulation: Right vertebral artery is small after takeoff of the right posterior inferior cerebellar artery. Ectatic vertebral arteries and basilar artery. Tiny bulge origin of the left superior cerebellar artery (1.1 mm). This may  be related to congenital bulge secondary to fetal type origin of the left posterior cerebral artery. Stability can be confirmed on follow-up MR angiogram in 6 months. Venous sinuses: Negative. Anatomic variants: As above. Delayed phase: As above. IMPRESSION: Present exam was performed in a delayed fashion secondary to interruption of the scanner toward injection. This limits evaluation particularly evaluation of intracranial vasculature. No evidence of dissection of the internal carotid artery from the bifurcation to the skullbase. Limited evaluation of vertebral artery secondary to venous contamination. Evaluation of the cavernous segment of the internal carotid arteries is limited secondary to venous contamination. No posterior communicating artery aneurysm is noted. If further delineation is clinically desired at current time, MR angiogram of the circle Willis may be considered. 1.1 mm bulge at the origin of the left superior cerebellar artery (possibly congenital). Stability of this can be confirmed on follow-up MR angiogram in 6 months. No intracranial mass, CT evidence of large acute infarct or intracranial hemorrhage. No primary orbital abnormality detected. 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 Heterogeneous partially calcified thyroid gland with dominant lesion on the left measuring up to 2 cm. Thyroid ultrasound recommended for further delineation. These results will be called to the ordering clinician or representative by the Radiologist Assistant, and communication documented in the PACS or zVision Dashboard. Electronically Signed: By: Genia Del M.D. On: 10/23/2015 16:48   Ct Orbits W/cm  10/24/2015  CLINICAL DATA:  60 year old female with episode of right sided ptosis which became painful overnight. Subsequent encounter. EXAM: CT ORBITS WITH CONTRAST TECHNIQUE: Multidetector CT imaging of the  orbits was performed following the bolus administration of intravenous contrast. CONTRAST:  57mL OMNIPAQUE IOHEXOL 300 MG/ML  SOLN COMPARISON:  CT angiogram 10/23/2015 FINDINGS: More conspicuous and progressive since prior CT angiogram is right preseptal inflammation consistent with preseptal cellulitis. Additionally, in the anterior superior lateral aspect of the right orbit is inflammatory process spanning over 1.3 x 0.4 x 1.4 cm. The lacrimal gland is inseparable from this region and it is possible this represents inflamed lacrimal gland versus abscess related to preseptal cellulitis with posterior displacement of the lacrimal gland. This insinuates between the superior rectus and lateral rectus muscle. This is immediately adjacent to the roof of the orbit. The preseptal inflammation extends toward seen nose. Mild inflammation of right lacrimal duct. Slightly prominent vein within the superior orbit without thrombosis may be reactive in origin. There is symmetric appearance of the cavernous sinus region with slight underfilling which may be related to phase of imaging rather than thrombosis. Per discussion with Dr. Danielle Rankin, patient does not have cranial nerve symptoms, temperature, white count were elevated sed rate. If the patient developed cranial nerve symptoms then cavernous sinus thrombosis from a retrograde spread of infection may need to be considered. On the current examination, the tiny bulge involving the left basilar tip is origin of a vessel rather than aneurysm. Mastoid air cells, middle ear cavities and paranasal sinuses are clear.  IMPRESSION: More conspicuous and progressive since prior CT angiogram is right preseptal inflammation consistent with preseptal cellulitis. Additionally, in the anterior superior lateral aspect of the right orbit is inflammatory process spanning over 1.3 x 0.4 x 1.4 cm. The lacrimal gland is inseparable from this region and it is possible this represents inflamed lacrimal  gland versus abscess related to preseptal cellulitis with posterior displacement of the lacrimal gland. This insinuates between the superior rectus and lateral rectus muscle. This is immediately adjacent to the roof of the orbit. The preseptal inflammation extends toward seen nose. Mild inflammation of right lacrimal duct. Slightly prominent vein within the superior orbit without thrombosis may be reactive in origin. There is symmetric appearance of the cavernous sinus region with underfilling related to phase of imaging rather than septic thrombosis. If the patient developed cranial nerve symptoms then cavernous sinus thrombosis from a retrograde spread of infection may need to be considered. On the current examination, the tiny bulge involving the left basilar tip is origin of a vessel rather than aneurysm. These results were called by telephone at the time of interpretation on 10/24/2015 at 2:00 pm to Dr. Arlyss Queen , who verbally acknowledged these results. Electronically Signed   By: Genia Del M.D.   On: 10/24/2015 15:03   I have personally reviewed and evaluated these images and lab results as part of my medical decision-making.   EKG Interpretation None      MDM   Final diagnoses:  None    Patient is a 60 year old female came in with 4 days of increasing right eye pain. She presents with notable ptosis on the right eye. No surrounding erythema. No pain with extra ocular movements. She had CT orbit order prior to arrival. Shows questionable cavernous sinus thrombosis with preseptal cellulitis. Given the questionable extension of infection and think it's reasonable to admit for IV antibiotics. Patient does have cranial nerve findings including ptosis.  4:31 PM Discussed with neurology. Will admit to [facility] with neurology following.   6:12 PM Discussed with hosptilist. They are asking for opthalmology consult. Called. Dr. Manuella Ghazi will see patient.   Glenda Kunst Julio Alm, MD 10/24/15  [place], MD 10/24/15 [id number]

## 2015-10-24 NOTE — Progress Notes (Signed)
Pt has order for MRI of  head and orbital. Pt reports that  she is claustrophobic. On-call provider Roger Shelter, PA notified via text page. Awaiting orders.

## 2015-10-24 NOTE — ED Notes (Signed)
Dr. Tyrell Antonio is seeing her as I write this. Pt. Remains in no distress and is aware of imminent transfer to [facility].

## 2015-10-25 ENCOUNTER — Inpatient Hospital Stay (HOSPITAL_COMMUNITY): Payer: 59

## 2015-10-25 LAB — CBC
HEMATOCRIT: 40 % (ref 36.0–46.0)
Hemoglobin: 13.6 g/dL (ref 12.0–15.0)
MCH: 31.1 pg (ref 26.0–34.0)
MCHC: 34 g/dL (ref 30.0–36.0)
MCV: 91.5 fL (ref 78.0–100.0)
Platelets: 202 10*3/uL (ref 150–400)
RBC: 4.37 MIL/uL (ref 3.87–5.11)
RDW: 12.3 % (ref 11.5–15.5)
WBC: 6 10*3/uL (ref 4.0–10.5)

## 2015-10-25 LAB — COMPREHENSIVE METABOLIC PANEL
ALBUMIN: 3.8 g/dL (ref 3.5–5.0)
ALK PHOS: 55 U/L (ref 38–126)
ALT: 32 U/L (ref 14–54)
AST: 35 U/L (ref 15–41)
Anion gap: 8 (ref 5–15)
BILIRUBIN TOTAL: 0.9 mg/dL (ref 0.3–1.2)
BUN: 11 mg/dL (ref 6–20)
CO2: 26 mmol/L (ref 22–32)
Calcium: 9 mg/dL (ref 8.9–10.3)
Chloride: 106 mmol/L (ref 101–111)
Creatinine, Ser: 0.79 mg/dL (ref 0.44–1.00)
GFR calc Af Amer: >60 mL/min (ref >60)
GFR calc non Af Amer: >60 mL/min (ref >60)
GLUCOSE: 143 mg/dL — AB (ref 65–99)
POTASSIUM: 3.9 mmol/L (ref 3.5–5.1)
Sodium: 140 mmol/L (ref 135–145)
TOTAL PROTEIN: 6.1 g/dL — AB (ref 6.5–8.1)

## 2015-10-25 MED ORDER — AMOXICILLIN-POT CLAVULANATE 875-125 MG PO TABS: 1.0000 | ORAL_TABLET | Freq: Two times a day (BID) | ORAL | Status: DC

## 2015-10-25 MED ORDER — GADOBENATE DIMEGLUMINE 529 MG/ML IV SOLN
15.0000 mL | Freq: Once | INTRAVENOUS | Status: AC | PRN
Start: 2015-10-25 — End: 2015-10-25
  Administered 2015-10-25: 15 mL via INTRAVENOUS

## 2015-10-25 NOTE — Progress Notes (Signed)
Pt given discharge instructions, prescriptions, and care notes. Pt verbalized understanding AEB no further questions or concerns at this time. IV was discontinued, no redness, pain, or swelling noted at this time. Pt left the floor via ambulation with staff in stable condition.

## 2015-10-25 NOTE — Care Management Note (Signed)
Case Management Note  Patient Details  Name: Emily Brandt MRN: [id number] Date of Birth: 02-25-1956  Subjective/Objective:                  Date- 10-25-15 Initial Assessment Spoke with patient at the bedside.  Introduced self as Tourist information centre manager and explained role in discharge planning and how to be reached.  Verified patient lives in  [place] with husband.  Verified patient anticipates to go home with spouse at time of discharge.  Patient has no DME. Expressed potential need for no other DME.  Patient denied needing help with their medication.  Patient drives to MD appointments.  Verified patient has PCP PA Mark Heppler at [facility]. [facility]. Patient states they currently receive [facility] services through no one.   Admission Comments: Patient transferred to [facility] from [facility] per her request for orbital cellulitis, Treated with 24 hours IV Abx, much improved.   Carles Collet RN BSN CM [phone]   Action/Plan:  Anticipate Dc to home today, self care.  Expected Discharge Date:                  Expected Discharge Plan:  Home/Self Care  In-House Referral:     Discharge planning Services  CM Consult  Post Acute Care Choice:    Choice offered to:     DME Arranged:    DME Agency:     HH Arranged:    [place] Agency:     Status of Service:  Completed, signed off  Medicare Important Message Given:    Date Medicare IM Given:    Medicare IM give by:    Date Additional Medicare IM Given:    Additional Medicare Important Message give by:     If discussed at [place] of Stay Meetings, dates discussed:    Additional Comments:  Carles Collet, RN 10/25/2015, 11:11 AM

## 2015-10-25 NOTE — Discharge Instructions (Signed)
Please take copy of my DC summary and show it to your Primary MD in a week. You need a thyroid ultrasound in a week to be scheduled by a primary care physician. Must follow with the eye doctor within a week as well.  Follow with Primary MD  in 7 days   Get CBC, CMP, 2 view Chest X ray checked  by Primary MD next visit.    Activity: As tolerated with Full fall precautions use walker/cane & assistance as needed   Disposition Home     Diet:   Heart Healthy    For Heart failure patients - Check your Weight same time everyday, if you gain over 2 pounds, or you develop in leg swelling, experience more shortness of breath or chest pain, call your Primary MD immediately. Follow Cardiac Low Salt Diet and 1.5 lit/day fluid restriction.   On your next visit with your primary care physician please Get Medicines reviewed and adjusted.   Please request your Prim.MD to go over all Hospital Tests and Procedure/Radiological results at the follow up, please get all Hospital records sent to your Prim MD by signing hospital release before you go home.   If you experience worsening of your admission symptoms, develop shortness of breath, life threatening emergency, suicidal or homicidal thoughts you must seek medical attention immediately by calling 911 or calling your MD immediately  if symptoms less severe.  You Must read complete instructions/literature along with all the possible adverse reactions/side effects for all the Medicines you take and that have been prescribed to you. Take any new Medicines after you have completely understood and accpet all the possible adverse reactions/side effects.   Do not drive, operating heavy machinery, perform activities at heights, swimming or participation in water activities or provide baby sitting services if your were admitted for syncope or siezures until you have seen by Primary MD or a Neurologist and advised to do so again.  Do not drive when taking Pain  medications.    Do not take more than prescribed Pain, Sleep and Anxiety Medications  Special Instructions: If you have smoked or chewed Tobacco  in the last 2 yrs please stop smoking, stop any regular Alcohol  and or any Recreational drug use.  Wear Seat belts while driving.   Please note  You were cared for by a hospitalist during your hospital stay. If you have any questions about your discharge medications or the care you received while you were in the hospital after you are discharged, you can call the unit and asked to speak with the hospitalist on call if the hospitalist that took care of you is not available. Once you are discharged, your primary care physician will handle any further medical issues. Please note that NO REFILLS for any discharge medications will be authorized once you are discharged, as it is imperative that you return to your primary care physician (or establish a relationship with a primary care physician if you do not have one) for your aftercare needs so that they can reassess your need for medications and monitor your lab values.

## 2015-10-25 NOTE — Discharge Summary (Signed)
Emily Brandt, is a 60 y.o. female  DOB Jan 27, 1956  MRN [id number].  Admission date:  10/24/2015  Admitting Physician  Gennaro Africa, MD  Discharge Date:  10/25/2015   Primary MD  PCP  Recommendations for primary care physician for things to follow:   kindly follow-up on autoimmune titers which include Anka, Sjogren syndrome, ACE levels, lysozyme. All pending.  She must follow with ophthalmology as requested below within a week  Thyroid ultrasound in the next 1-2 weeks.  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 Abnormal finding on Pap smear     Cervicitis  . Endometrial polyp   . Herpes progenitalis   . MVP (mitral valve prolapse)     antibiotic for dental procedures  . Gallstones   . GERD (gastroesophageal reflux disease)   . Arthritis   . Elevated LFTs     Past Surgical History  Procedure Laterality Date  . Hysteroscopy    . Dilation and curettage of uterus    . Nasal septum surgery    . Tonsillectomy    . Cervical fusion    . Shoulder surgery    . Colposcopy    . Nasal septum surgery  1974  . Uterine fibroid surgery  2005  . Tonsillectomy  1965  . Laparoscopic cholecystectomy  09/03/2012       HPI  from the history and physical done on the day of admission:    Emily Brandt is a 60 y.o. female with PMH of MVP, Cervical Spine fusion who presents today with worsening pain, swelling of right eye. She started to have some discomfort and swelling of right eye 4 days prior to admission. She went  to urgent care on the 21 and a CT head and neck was done which was read normal. She return 1-22 to urgent care with persistent and worsening symptoms. A ct orbit was done which showed preseptal cellulitis and question of cavernous sinus thrombosis. Also the CT orbit reveal  in the anterior superior lateral aspect of the right orbit is inflammatory process spanning over 1.3 x 0.4 x 1.4 cm. The lacrimal gland is inseparable from this region and it is possible this represents inflamed lacrimal gland versus abscess related to preseptal cellulitis with posterior displacement of the lacrimal gland.  Patient denies fever, headaches, visual changes, mild pressure of right eye with movement.  Other evalutaion in the ED; lactic acid 1.5, normal WBC and electrolytes.      Hospital Course:     1. Right eye swelling and pain. Initially suspicious for cavernous sinus thrombosis but ruled out with negative MRV, seen by ophthalmologist Dr. Illa Level, discussed the case with him, he reviewed the imaging results and is  off the suspicion that these changes are consistent with either malignancy or autoimmune disease. He requests outpatient follow-up with him and Dr. Lorin Mercy at [facility] which has been requested. Patient will complete 10 day course of oral Augmentin for low clinical suspicion of possible preseptal cellulitis. Exam is benign and not consistent with infection. Discussed with ophthalmology today in detail agrees with the plan.  2. Hx of spinal fusion. Continue Neurontin.   3. History of calcified thyroid gland. Outpatient thyroid ultrasound needed.   Discharge Condition: Stable  Follow UP  Follow-up Information    Follow up with Arta Bruce, OD. Schedule an appointment as soon as possible for a visit in 1 week.   Specialty:  Ophthalmology   Why:  Right eye inflammation suspicious for malignancy   Contact information:   [place] [id number] [phone]        Follow up with Danice Goltz, MD. Schedule an appointment as soon as possible for a visit in 1 week.   Specialty:  Ophthalmology   Contact information:   [place] [address] [phone]       Follow up with Your PCP. Schedule an appointment as soon as possible for a visit in 1 week.   Why:  show then copy of my discharge summary       Consults obtained - ophthalmology Dr. Manuella Ghazi  Diet and Activity recommendation: See Discharge Instructions below  Discharge Instructions          Discharge Instructions    Diet - low sodium heart healthy    Complete by:  As directed      Discharge instructions    Complete by:  As directed   Please take copy of my DC summary and show it to your Primary MD in a week. You need a thyroid ultrasound in a week to be scheduled by a primary care physician. Must follow with the eye doctor within a week as well.  Follow with Primary MD  in 7 days   Get CBC, CMP, 2 view Chest X ray checked  by Primary MD next visit.    Activity: As tolerated with Full fall precautions use walker/cane & assistance as needed   Disposition Home     Diet:   Heart Healthy    For Heart failure patients - Check your Weight same time everyday, if you gain over 2 pounds, or you develop in leg swelling, experience more shortness of breath or chest pain, call your Primary MD immediately. Follow Cardiac Low Salt Diet and 1.5 lit/day fluid restriction.   On your next visit with your primary care physician please Get Medicines reviewed and adjusted.   Please request your Prim.MD to go over all Hospital Tests and Procedure/Radiological results at the follow up, please get all Hospital records sent to your Prim MD by signing hospital release before you go home.   If you experience worsening of your admission symptoms, develop shortness of breath, life threatening emergency, suicidal or homicidal thoughts you must seek medical attention immediately by  calling 911 or calling your MD immediately  if symptoms less severe.  You Must read complete instructions/literature along with all the possible adverse reactions/side effects for all the Medicines you take and that have been prescribed to you. Take any new Medicines after you have completely understood and accpet all the possible adverse reactions/side effects.   Do not drive, operating heavy machinery, perform activities at heights, swimming or participation in  water activities or provide baby sitting services if your were admitted for syncope or siezures until you have seen by Primary MD or a Neurologist and advised to do so again.  Do not drive when taking Pain medications.    Do not take more than prescribed Pain, Sleep and Anxiety Medications  Special Instructions: If you have smoked or chewed Tobacco  in the last 2 yrs please stop smoking, stop any regular Alcohol  and or any Recreational drug use.  Wear Seat belts while driving.   Please note  You were cared for by a hospitalist during your hospital stay. If you have any questions about your discharge medications or the care you received while you were in the hospital after you are discharged, you can call the unit and asked to speak with the hospitalist on call if the hospitalist that took care of you is not available. Once you are discharged, your primary care physician will handle any further medical issues. Please note that NO REFILLS for any discharge medications will be authorized once you are discharged, as it is imperative that you return to your primary care physician (or establish a relationship with a primary care physician if you do not have one) for your aftercare needs so that they can reassess your need for medications and monitor your lab values.     Increase activity slowly    Complete by:  As directed              Discharge Medications       Medication List    TAKE these medications        acyclovir 400 MG  tablet  Commonly known as:  ZOVIRAX  Take 1 tablet (400 mg total) by mouth daily.     amoxicillin-clavulanate 875-125 MG tablet  Commonly known as:  AUGMENTIN  Take 1 tablet by mouth 2 (two) times daily. Finish this course as decided previously, total 10 days.     BIOTIN PO  Take 1 tablet by mouth daily.     CALCIUM PO  Take 1 tablet by mouth daily.     CO Q 10 PO  Take 1 tablet by mouth daily.     fish oil-omega-3 fatty acids 1000 MG capsule  Take 1 g by mouth daily.     gabapentin 400 MG capsule  Commonly known as:  NEURONTIN  Take 400 mg by mouth 3 (three) times daily.     GINGER ROOT PO  Take 1 capsule by mouth daily.     multivitamin tablet  Take 1 tablet by mouth daily.     NON FORMULARY  Take 1 capsule by mouth daily. Primrose oil daily     nortriptyline 50 MG capsule  Commonly known as:  PAMELOR  TAKE 1 CAPSULE (50 MG TOTAL) BY MOUTH AT BEDTIME     TURMERIC PO  Take 1 capsule by mouth daily.        Major procedures and Radiology Reports - PLEASE review detailed and final reports for all details, in brief -       Ct Angio Head W/cm &/or Wo Cm  10/24/2015  ADDENDUM REPORT: 10/24/2015 13:12 ADDENDUM: Per discussion with Dr. Everlene Farrier, there is slight infiltration of fat planes in the right preseptal region and anterior to the right lateral orbital wall suggesting preseptal inflammation. No postseptal extension. Electronically Signed   By: Genia Del M.D.   On: 10/24/2015 13:12  10/24/2015  CLINICAL DATA:  60 year old female with right ptosis.  Initial encounter. EXAM: CT ANGIOGRAPHY HEAD AND NECK TECHNIQUE: Multidetector CT imaging of the head and neck was performed using the standard protocol during bolus administration of intravenous contrast. Multiplanar CT image reconstructions and MIPs were obtained to evaluate the vascular anatomy. Carotid stenosis measurements (when applicable) are obtained utilizing NASCET criteria, using the distal internal carotid  diameter as the denominator. CONTRAST:  126mL OMNIPAQUE IOHEXOL 350 MG/ML SOLN COMPARISON:  None. FINDINGS: CT HEAD Brain: No intracranial hemorrhage or CT evidence of large acute infarct. No intracranial mass or abnormal enhancement. No hydrocephalus. Calvarium and skull base: Negative. Paranasal sinuses: Clear. Orbits: Symmetric normal appearance of orbital structures including extra-ocular muscles. Symmetric normal appearance of superior ophthalmic veins and cavernous sinus. CTA NECK Aortic arch: 3 vessel aortic arch. Right carotid system: No evidence of dissection. Left carotid system: No evidence of dissection. Vertebral arteries:Evaluation limited secondary to phase of contrast injection. 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 Other neck: Heterogeneous partially calcified thyroid gland with dominant lesion on the left measuring up to 2 cm. Thyroid ultrasound recommended for further delineation. CTA HEAD Anterior circulation: Evaluation limited by phase of contrast injection. No posterior communicating artery aneurysm noted. Limited evaluation cavernous segment of the internal carotid artery secondary to venous contamination. Posterior circulation: Right vertebral artery is small after takeoff of the right posterior inferior cerebellar artery. Ectatic vertebral arteries and basilar artery. Tiny bulge origin of the left superior cerebellar artery (1.1 mm). This may be related to congenital bulge secondary to fetal type origin of the left posterior cerebral artery. Stability can be confirmed on follow-up MR angiogram in 6 months. Venous sinuses: Negative. Anatomic variants: As above. Delayed phase: As above. IMPRESSION: Present exam was performed in a delayed fashion secondary to interruption of the scanner toward injection. This limits evaluation particularly evaluation of intracranial vasculature. No  evidence of dissection of the internal carotid artery from the bifurcation to the skullbase. Limited evaluation of vertebral artery secondary to venous contamination. Evaluation of the cavernous segment of the internal carotid arteries is limited secondary to venous contamination. No posterior communicating artery aneurysm is noted. If further delineation is clinically desired at current time, MR angiogram of the circle Willis may be considered. 1.1 mm bulge at the origin of the left superior cerebellar artery (possibly congenital). Stability of this can be confirmed on follow-up MR angiogram in 6 months. No intracranial mass, CT evidence of large acute infarct or intracranial hemorrhage. No primary orbital abnormality detected. 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 Heterogeneous partially calcified thyroid gland with dominant lesion on the left measuring up to 2 cm. Thyroid ultrasound recommended for further delineation. These results will be called to the ordering clinician or representative by the Radiologist Assistant, and communication documented in the PACS or zVision Dashboard. Electronically Signed: By: Genia Del M.D. On: 10/23/2015 16:48   Ct Angio Neck W/cm &/or Wo/cm  10/24/2015  ADDENDUM REPORT: 10/24/2015 13:12 ADDENDUM: Per discussion with Dr. Everlene Farrier, there is slight infiltration of fat planes in the right preseptal region and anterior to the right lateral orbital wall suggesting preseptal inflammation. No postseptal extension. Electronically Signed   By: Genia Del M.D.   On: 10/24/2015 13:12  10/24/2015  CLINICAL DATA:  60 year old female with right ptosis. Initial encounter. EXAM: CT ANGIOGRAPHY HEAD AND NECK TECHNIQUE: Multidetector CT imaging of the head and neck  was performed using the standard protocol during bolus administration of intravenous contrast. Multiplanar CT image  reconstructions and MIPs were obtained to evaluate the vascular anatomy. Carotid stenosis measurements (when applicable) are obtained utilizing NASCET criteria, using the distal internal carotid diameter as the denominator. CONTRAST:  169mL OMNIPAQUE IOHEXOL 350 MG/ML SOLN COMPARISON:  None. FINDINGS: CT HEAD Brain: No intracranial hemorrhage or CT evidence of large acute infarct. No intracranial mass or abnormal enhancement. No hydrocephalus. Calvarium and skull base: Negative. Paranasal sinuses: Clear. Orbits: Symmetric normal appearance of orbital structures including extra-ocular muscles. Symmetric normal appearance of superior ophthalmic veins and cavernous sinus. CTA NECK Aortic arch: 3 vessel aortic arch. Right carotid system: No evidence of dissection. Left carotid system: No evidence of dissection. Vertebral arteries:Evaluation limited secondary to phase of contrast injection. 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 Other neck: Heterogeneous partially calcified thyroid gland with dominant lesion on the left measuring up to 2 cm. Thyroid ultrasound recommended for further delineation. CTA HEAD Anterior circulation: Evaluation limited by phase of contrast injection. No posterior communicating artery aneurysm noted. Limited evaluation cavernous segment of the internal carotid artery secondary to venous contamination. Posterior circulation: Right vertebral artery is small after takeoff of the right posterior inferior cerebellar artery. Ectatic vertebral arteries and basilar artery. Tiny bulge origin of the left superior cerebellar artery (1.1 mm). This may be related to congenital bulge secondary to fetal type origin of the left posterior cerebral artery. Stability can be confirmed on follow-up MR angiogram in 6 months. Venous sinuses: Negative. Anatomic variants: As above. Delayed phase: As above.  IMPRESSION: Present exam was performed in a delayed fashion secondary to interruption of the scanner toward injection. This limits evaluation particularly evaluation of intracranial vasculature. No evidence of dissection of the internal carotid artery from the bifurcation to the skullbase. Limited evaluation of vertebral artery secondary to venous contamination. Evaluation of the cavernous segment of the internal carotid arteries is limited secondary to venous contamination. No posterior communicating artery aneurysm is noted. If further delineation is clinically desired at current time, MR angiogram of the circle Willis may be considered. 1.1 mm bulge at the origin of the left superior cerebellar artery (possibly congenital). Stability of this can be confirmed on follow-up MR angiogram in 6 months. No intracranial mass, CT evidence of large acute infarct or intracranial hemorrhage. No primary orbital abnormality detected. 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 Heterogeneous partially calcified thyroid gland with dominant lesion on the left measuring up to 2 cm. Thyroid ultrasound recommended for further delineation. These results will be called to the ordering clinician or representative by the Radiologist Assistant, and communication documented in the PACS or zVision Dashboard. Electronically Signed: By: Genia Del M.D. On: 10/23/2015 16:48   Mr Emily Brandt  10/25/2015  CLINICAL DATA:  Initial evaluation for right preseptal cellulitis. EXAM: MR VENOGRAM the HEAD WITHOUT AND WITH CONTRAST TECHNIQUE: Angiographic images of the intracranial venous structures were obtained using MRV technique without and with intravenous contrast. COMPARISON:  None. FINDINGS: The major dural venous sinuses are patent with no filling defect identified to suggest venous sinus thrombosis. Specifically, the superior, left  transverse, and left sigmoid sinuses are widely patent. The right transverse sinus is markedly hypoplastic and not well visualized. Flow seen distally within the right sigmoid sinus and proximal internal jugular vein. Widely patent flow within  the left internal jugular vein as well. Straight sinus, vein of Galen, and internal cerebral veins patent bilaterally. No findings to suggest cavernous sinus thrombosis identified. IMPRESSION: 1. No evidence for dural venous sinus thrombosis. 2. Hypoplastic right transverse sinus.  Otherwise unremarkable MRV. Electronically Signed   By: Jeannine Boga M.D.   On: 10/25/2015 06:06   Ct Orbits W/cm  10/24/2015  ADDENDUM REPORT: 10/24/2015 17:30 ADDENDUM: Patient to receive antibiotics per phone conversation with Dr. Everlene Farrier. If the right lacrimal gland region fullness does not clear with antibiotics then other considerations such that primary lacrimal gland abnormality (lymphoma, pseudotumor or other inflammatory process) may then be considered. Electronically Signed   By: Genia Del M.D.   On: 10/24/2015 17:30  10/24/2015  CLINICAL DATA:  60 year old female with episode of right sided ptosis which became painful overnight. Subsequent encounter. EXAM: CT ORBITS WITH CONTRAST TECHNIQUE: Multidetector CT imaging of the orbits was performed following the bolus administration of intravenous contrast. CONTRAST:  28mL OMNIPAQUE IOHEXOL 300 MG/ML  SOLN COMPARISON:  CT angiogram 10/23/2015 FINDINGS: More conspicuous and progressive since prior CT angiogram is right preseptal inflammation consistent with preseptal cellulitis. Additionally, in the anterior superior lateral aspect of the right orbit is inflammatory process spanning over 1.3 x 0.4 x 1.4 cm. The lacrimal gland is inseparable from this region and it is possible this represents inflamed lacrimal gland versus abscess related to preseptal cellulitis with posterior displacement of the lacrimal gland. This insinuates  between the superior rectus and lateral rectus muscle. This is immediately adjacent to the roof of the orbit. The preseptal inflammation extends toward seen nose. Mild inflammation of right lacrimal duct. Slightly prominent vein within the superior orbit without thrombosis may be reactive in origin. There is symmetric appearance of the cavernous sinus region with slight underfilling which may be related to phase of imaging rather than thrombosis. Per discussion with Dr. Danielle Rankin, patient does not have cranial nerve symptoms, temperature, white count were elevated sed rate. If the patient developed cranial nerve symptoms then cavernous sinus thrombosis from a retrograde spread of infection may need to be considered. On the current examination, the tiny bulge involving the left basilar tip is origin of a vessel rather than aneurysm. Mastoid air cells, middle ear cavities and paranasal sinuses are clear. IMPRESSION: More conspicuous and progressive since prior CT angiogram is right preseptal inflammation consistent with preseptal cellulitis. Additionally, in the anterior superior lateral aspect of the right orbit is inflammatory process spanning over 1.3 x 0.4 x 1.4 cm. The lacrimal gland is inseparable from this region and it is possible this represents inflamed lacrimal gland versus abscess related to preseptal cellulitis with posterior displacement of the lacrimal gland. This insinuates between the superior rectus and lateral rectus muscle. This is immediately adjacent to the roof of the orbit. The preseptal inflammation extends toward seen nose. Mild inflammation of right lacrimal duct. Slightly prominent vein within the superior orbit without thrombosis may be reactive in origin. There is symmetric appearance of the cavernous sinus region with underfilling related to phase of imaging rather than septic thrombosis. If the patient developed cranial nerve symptoms then cavernous sinus thrombosis from a retrograde spread  of infection may need to be considered. On the current examination, the tiny bulge involving the left basilar tip is origin of a vessel rather than aneurysm. These results were called by telephone at the time of interpretation on 10/24/2015 at 2:00 pm to Dr. Arlyss Queen , who verbally acknowledged these results. Electronically Signed:  By: Genia Del M.D. On: 10/24/2015 15:03   Mr Emily Brandt Wo/w Cm  10/25/2015  CLINICAL DATA:  Initial evaluation for right-sided orbital cellulitis. EXAM: MRI OF THE ORBITS WITHOUT AND WITH CONTRAST TECHNIQUE: Multiplanar, multisequence MR imaging of the orbits was performed both before and after the administration of intravenous contrast. CONTRAST:  15 cc of MultiHance. COMPARISON:  Prior study from 10/24/2015. FINDINGS: MRI of the right orbit demonstrates mild inflammatory stranding and enhancement within the preseptal soft tissues, consistent with preseptal cellulitis. This involves the upper eye lead as well as the infraorbital soft tissues (series 5, image 14). Preseptal inflammation extends medially towards the nose. Additionally, mild inflammatory changes extends laterally towards the right zygomatic region (series 11, image 20). Again, there is inflammatory changes/enhancement within the anterior and superolateral aspect of the right orbit at the level of the lacrimal gland (series 11, image 22). This is extraconal in location. As described previously, this is insinuated between the superior and lateral rectus muscles. This area measures approximately 2.4 x 0.6 x 1.7 cm. Unclear whether this reflects the enlarged and inflamed lacrimal gland itself or possibly adjacent inflammatory phlegmon. No discrete abscess. The visible portions of the superior and lateral rectus muscles are normal in appearance. Minimal asymmetric prominence of the right superior orbital vein without evidence for thrombosis. Remainder of the extraocular muscles within the right orbit including the medial,  inferior, and superior oblique rectus muscles are normal in appearance and symmetric with the left orbit. Other than the inflammatory changes within the superolateral aspect of the right orbit, no significant inflammatory changes within the bony right orbit. The extraconal and intraconal fat are well preserved and normal in appearance. No inflammatory changes at the right orbital apex. No evidence for cavernous sinus infection or thrombosis. The right globe itself is normal in appearance. Right optic nerve is normal. Left globe and orbit are normal without acute abnormality or inflammatory changes. Visualized portions of the brain are within normal limits. Visualized paranasal sinuses are clear.  No mastoid effusion. IMPRESSION: 1. Findings consistent with right-sided preseptal cellulitis. 2. Abnormal enhancement and/or phlegmonous change within the anterior and superolateral aspect of the right orbit in the region of the right lacrimal gland. Unclear whether this reflects the inflamed lacrimal gland itself and/or phlegmonous change in this region. No discrete abscess. As discussed previously, if this lacrimal gland region fullness and enhancement does not clear with antibiotics, other considerations including a primary lacrimal gland abnormality such as lymphoma, pseudotumor, or other inflammatory process could be considered. No MRI evidence for extension of these inflammatory changes posteriorly into the bony right orbit or into the orbital apex. No evidence for cavernous sinus infection or thrombosis. 3. Mild asymmetric prominence of the right superior orbital vein without thrombosis. Again, this may be reactive. Electronically Signed   By: Jeannine Boga M.D.   On: 10/25/2015 06:40    Micro Results      No results found for this or any previous visit (from the past 240 hour(s)).     Today   Subjective    Emily Brandt today has no headache,no chest abdominal pain,no new weakness tingling  or numbness, feels much better wants to go home today.     Objective   Blood pressure 124/60, pulse 95, temperature 97.8 F (36.6 C), temperature source Oral, resp. rate 18, height 5\' 5"  (1.651 m), weight 72.757 kg (160 lb 6.4 oz), SpO2 97 %.   Intake/Output Summary (Last 24 hours) at 10/25/15 1040 Last data  filed at 10/25/15 1020  Gross per 24 hour  Intake   1220 ml  Output      0 ml  Net   1220 ml    Exam Awake Alert, Oriented x 3, No new F.N deficits, Normal affect Gail.AT,PERRAL, mild swelling around the right eye and upper eyelid, no redness or warmth, no tenderness on palpation or on extraocular movement Supple Neck,No JVD, No cervical lymphadenopathy appriciated.  Symmetrical Chest wall movement, Good air movement bilaterally, CTAB RRR,No Gallops,Rubs or new Murmurs, No Parasternal Heave +ve B.Sounds, Abd Soft, Non tender, No organomegaly appriciated, No rebound -guarding or rigidity. No Cyanosis, Clubbing or edema, No new Rash or bruise   Data Review   CBC w Diff:  Lab Results  Component Value Date   WBC 6.0 10/25/2015   WBC 5.6 10/24/2015   HGB 13.6 10/25/2015   HGB 14.3 10/24/2015   HCT 40.0 10/25/2015   HCT 42.1 10/24/2015   PLT 202 10/25/2015   LYMPHOPCT 22 10/24/2015   MONOPCT 8 10/24/2015   EOSPCT 2 10/24/2015   BASOPCT 0 10/24/2015    CMP:  Lab Results  Component Value Date   NA 140 10/25/2015   K 3.9 10/25/2015   CL 106 10/25/2015   CO2 26 10/25/2015   BUN 11 10/25/2015   CREATININE 0.79 10/25/2015   CREATININE 0.73 07/27/2015   PROT 6.1* 10/25/2015   ALBUMIN 3.8 10/25/2015   BILITOT 0.9 10/25/2015   ALKPHOS 55 10/25/2015   AST 35 10/25/2015   ALT 32 10/25/2015  .   Total Time in preparing paper work, data evaluation and todays exam - 35 minutes  Thurnell Lose M.D on 10/25/2015 at 10:40 AM  [facility]   Office  [phone]

## 2015-10-26 LAB — SJOGRENS SYNDROME-A EXTRACTABLE NUCLEAR ANTIBODY

## 2015-10-26 LAB — ANGIOTENSIN CONVERTING ENZYME: Angiotensin-Converting Enzyme: <15 U/L (ref 14–82)

## 2015-10-27 ENCOUNTER — Telehealth: Payer: Self-pay

## 2015-10-27 LAB — ANCA TITERS: P-ANCA: <1:20 {titer}

## 2015-10-27 LAB — ACETYLCHOLINE RECEPTOR, BLOCKING: ACHR Blocking Abs: <15 % inhibit (ref <15)

## 2015-10-27 NOTE — Telephone Encounter (Signed)
PA completed on covermymeds for valacyclovir. Acyclovir is preferred, but pt had outbreak despite having been on daily suppression therapy with acyclovir. PA pending.

## 2015-10-28 LAB — ACETYLCHOLINE RECEPTOR, BINDING

## 2015-10-28 NOTE — Telephone Encounter (Signed)
Received denial (for #90? - Rx was only for #21), but letter stated that up to #34 are covered per Rx. The #21 Rxd should be covered. Faxed this info to pharm and asked them to call pharm help desk if still a problem.

## 2015-10-29 LAB — LYSOZYME, SERUM: Lysozyme: 4.3 ug/mL — ABNORMAL LOW (ref 5.0–11.0)

## 2015-11-01 LAB — ACETYLCHOLINE RECEPTOR, MODULATING: ACETYLCHOL MODUL AB: 13 %{inhibition}

## 2015-11-02 ENCOUNTER — Encounter: Payer: Self-pay | Admitting: *Deleted

## 2015-11-03 ENCOUNTER — Other Ambulatory Visit: Payer: 59

## 2015-11-04 ENCOUNTER — Ambulatory Visit
Admission: RE | Admit: 2015-11-04 | Discharge: 2015-11-04 | Disposition: A | Discharge status: Home / Self Care | Payer: 59 | Source: Ambulatory Visit | Attending: Emergency Medicine | Admitting: Emergency Medicine

## 2015-11-04 ENCOUNTER — Ambulatory Visit
Admission: RE | Admit: 2015-11-04 | Discharge: 2015-11-04 | Disposition: A | Discharge status: Home / Self Care | Payer: 59 | Source: Ambulatory Visit | Attending: Physician Assistant | Admitting: Physician Assistant

## 2015-11-04 ENCOUNTER — Other Ambulatory Visit: Payer: Self-pay | Admitting: Physician Assistant

## 2015-11-04 ENCOUNTER — Other Ambulatory Visit: Payer: Self-pay | Admitting: Emergency Medicine

## 2015-11-04 DIAGNOSIS — E041 Nontoxic single thyroid nodule: Secondary | ICD-10-CM

## 2015-11-04 DIAGNOSIS — R05 Cough: Secondary | ICD-10-CM

## 2015-11-04 DIAGNOSIS — R059 Cough, unspecified: Secondary | ICD-10-CM

## 2015-11-04 DIAGNOSIS — E042 Nontoxic multinodular goiter: Secondary | ICD-10-CM

## 2015-11-18 ENCOUNTER — Other Ambulatory Visit: Payer: Self-pay

## 2015-11-18 DIAGNOSIS — Z1231 Encounter for screening mammogram for malignant neoplasm of breast: Secondary | ICD-10-CM

## 2015-11-30 ENCOUNTER — Ambulatory Visit (INDEPENDENT_AMBULATORY_CARE_PROVIDER_SITE_OTHER): Payer: 59 | Admitting: Endocrinology

## 2015-11-30 ENCOUNTER — Encounter: Payer: Self-pay | Admitting: Endocrinology

## 2015-11-30 VITALS — BP 134/78 | HR 92 | Temp 98.4°F | Resp 14 | Ht 65.25 in | Wt 159.6 lb

## 2015-11-30 DIAGNOSIS — E042 Nontoxic multinodular goiter: Secondary | ICD-10-CM

## 2015-11-30 NOTE — Progress Notes (Signed)
Patient ID: Emily Brandt, female   DOB: [birth year], 60 y.o.   MRN: [id number]           Reason for Appointment: Goiter, new consultation    History of Present Illness:   The patient's thyroid enlargement was first discovered in 1/17 incidentally when she was having a CT scan  She has had no difficulty with swallowing   Does not feel like she has any choking sensation in her neck or pressure in any position or when lying down.  Lab Results  Component Value Date   TSH 1.322 10/24/2015   TSH 1.260 07/27/2015   TSH 1.303 07/22/2014    She has had an ultrasound exam in 11/2015 which showed the following  LEFT lobe: Hypoechoic solid left midpole nodule measures 21 x 16 x 14 mm.  Echogenic partially cystic nodule in the lower pole measures 22 x 16 x 17 mm.  The right lobe showed: Solid midpole nodule with coarse calcification measures 13 mm in greatest dimension. Mid to lower pole large dystrophic calcification measures 15 x 10 x12 mm with posterior shadowing. Hypoechoic partially cystic nodule in the right lower pole measures10 x 8 x 9 mm.    Medication List       This list is accurate as of: 11/30/15  9:33 PM.  Always use your most recent med list.               acyclovir 400 MG tablet  Commonly known as:  ZOVIRAX  Take 1 tablet (400 mg total) by mouth daily.     amoxicillin-clavulanate 875-125 MG tablet  Commonly known as:  AUGMENTIN  Take 1 tablet by mouth 2 (two) times daily. Finish this course as decided previously, total 10 days.     BIOTIN PO  Take 1 tablet by mouth daily. Reported on 11/30/2015     CALCIUM PO  Take 1 tablet by mouth daily.     CO Q 10 PO  Take 1 tablet by mouth daily.     fish oil-omega-3 fatty acids 1000 MG capsule  Take 1 g by mouth daily.     gabapentin 400 MG capsule  Commonly known as:  NEURONTIN  Take 400 mg by mouth 3 (three) times daily.     GINGER ROOT PO  Take 1 capsule by mouth daily. Reported on 11/30/2015     multivitamin tablet  Take 1 tablet by mouth daily.     NON FORMULARY  Take 1 capsule by mouth daily. Primrose oil daily     nortriptyline 50 MG capsule  Commonly known as:  PAMELOR  TAKE 1 CAPSULE (50 MG TOTAL) BY MOUTH AT BEDTIME     TURMERIC PO  Take 1 capsule by mouth daily. Reported on 11/30/2015        Allergies:  Allergies  Allergen Reactions  . Sulfa Antibiotics Swelling    Past Medical History  Diagnosis Date  . Abnormal finding on Pap smear     Cervicitis  . Endometrial polyp   . Herpes progenitalis   . MVP (mitral valve prolapse)     antibiotic for dental procedures  . Gallstones   . GERD (gastroesophageal reflux disease)   . Arthritis   . Elevated LFTs     Past Surgical History  Procedure Laterality Date  . Hysteroscopy    . Dilation and curettage of uterus    . Nasal septum surgery    . Tonsillectomy    . Cervical fusion    .  Shoulder surgery    . Colposcopy    . Nasal septum surgery  1974  . Uterine fibroid surgery  2005  . Tonsillectomy  1965  . Laparoscopic cholecystectomy  09/03/2012    Family History  Problem Relation Age of Onset  . Adopted: Yes  . Colon cancer Mother   . Hypertension Mother   . Osteoporosis Mother   . Cancer Mother   . Heart disease Sister   . Diabetes Maternal Grandmother   . Heart disease Maternal Grandfather   . Esophageal cancer Neg Hx   . Rectal cancer Neg Hx   . Stomach cancer Neg Hx     Social History:  reports that she has never smoked. She has never used smokeless tobacco. She reports that she drinks alcohol. She reports that she does not use illicit drugs.   Review of Systems:  Review of Systems  Constitutional: Negative for weight loss.  HENT: Negative for trouble swallowing.   Eyes:       Recently had an infection of her eye requiring hospitalization  Endocrine: Negative for fatigue.  Musculoskeletal: Positive for joint pain.      Examination:   BP 134/78 mmHg  Pulse 92  Temp(Src) 98.4  F (36.9 C)  Resp 14  Ht 5' 5.25" (1.657 m)  Wt 159 lb 9.6 oz (72.394 kg)  BMI 26.37 kg/m2  SpO2 96%   General Appearance: pleasant,          Eyes: No abnormal prominence or eyelid swelling.          Neck: The thyroid is enlarged.  Right lobe is about 1-1/2-2 times normal, relatively irregular and firm.  Somewhat indistinct nodule, firm and smooth felt on the left side on swallowing, measuring about 2 cm  .There is no stridor.  There is no lymphadenopathy.     Cardiovascular: Normal  heart sounds, no murmur Respiratory:  Lungs clear Neurological: REFLEXES: at biceps are normal.  Skin: no rash        Assessment/Plan:  Multinodular goiter with multiple nodules on each side, discovered incidentally on CT scan done for other reasons.  Although the left lobe nodules are slightly bigger they did not show any unusual characteristics Overall the largest nodule is only about 2.2 cm  Patient also feels that she has felt her left lobe nodule for sometime previously on her own Also presence of dystrophic calcification likely indicates a long-standing process of degeneration  Discussed with the patient that since she does not have a distinctly dominant nodule it may be reasonable to follow her ultrasound with repeat exam in 6 months Given her an article on thyroid nodules.  Discussed that chances of malignancy are usually 5% in solitary dominant nodules and probably less in multinodular goiter  [facility] 11/30/2015

## 2015-12-16 ENCOUNTER — Ambulatory Visit
Admission: RE | Admit: 2015-12-16 | Discharge: 2015-12-16 | Disposition: A | Discharge status: Home / Self Care | Payer: 59 | Source: Ambulatory Visit

## 2015-12-16 DIAGNOSIS — Z1231 Encounter for screening mammogram for malignant neoplasm of breast: Secondary | ICD-10-CM

## 2015-12-17 ENCOUNTER — Other Ambulatory Visit: Payer: Self-pay | Admitting: Gynecology

## 2015-12-17 DIAGNOSIS — R928 Other abnormal and inconclusive findings on diagnostic imaging of breast: Secondary | ICD-10-CM

## 2015-12-23 ENCOUNTER — Ambulatory Visit
Admission: RE | Admit: 2015-12-23 | Discharge: 2015-12-23 | Disposition: A | Discharge status: Home / Self Care | Payer: 59 | Source: Ambulatory Visit | Attending: Gynecology | Admitting: Gynecology

## 2015-12-23 DIAGNOSIS — R928 Other abnormal and inconclusive findings on diagnostic imaging of breast: Secondary | ICD-10-CM

## 2016-01-25 ENCOUNTER — Other Ambulatory Visit: Payer: Self-pay | Admitting: Anesthesiology

## 2016-01-25 DIAGNOSIS — Z1211 Encounter for screening for malignant neoplasm of colon: Secondary | ICD-10-CM

## 2016-01-26 ENCOUNTER — Encounter: Payer: Self-pay | Admitting: Gynecology

## 2016-05-10 ENCOUNTER — Ambulatory Visit
Admission: RE | Admit: 2016-05-10 | Discharge: 2016-05-10 | Disposition: A | Discharge status: Home / Self Care | Payer: 59 | Source: Ambulatory Visit | Attending: Endocrinology | Admitting: Endocrinology

## 2016-05-10 DIAGNOSIS — E042 Nontoxic multinodular goiter: Secondary | ICD-10-CM

## 2016-05-23 ENCOUNTER — Other Ambulatory Visit (INDEPENDENT_AMBULATORY_CARE_PROVIDER_SITE_OTHER): Payer: 59

## 2016-05-23 DIAGNOSIS — E042 Nontoxic multinodular goiter: Secondary | ICD-10-CM

## 2016-05-23 LAB — T4, FREE: Free T4: 1.21 ng/dL (ref 0.60–1.60)

## 2016-05-23 LAB — TSH: TSH: 1.05 u[IU]/mL (ref 0.35–4.50)

## 2016-05-29 ENCOUNTER — Other Ambulatory Visit: Payer: 59

## 2016-05-31 ENCOUNTER — Ambulatory Visit (INDEPENDENT_AMBULATORY_CARE_PROVIDER_SITE_OTHER): Payer: 59 | Admitting: Endocrinology

## 2016-05-31 ENCOUNTER — Encounter: Payer: Self-pay | Admitting: Endocrinology

## 2016-05-31 VITALS — BP 124/78 | HR 95 | Ht 65.0 in | Wt 159.0 lb

## 2016-05-31 DIAGNOSIS — E042 Nontoxic multinodular goiter: Secondary | ICD-10-CM | POA: Diagnosis not present

## 2016-05-31 NOTE — Progress Notes (Signed)
Patient ID: Emily Brandt, female   DOB: 1956-02-02, 60 y.o.   MRN: [id number]           Reason for Appointment: Follow-up of multiple nodules    History of Present Illness:   The patient's thyroid enlargement was first discovered in 1/17 incidentally when she was having a CT scan  She has had no difficulty with swallowing   Does not feel like she has any choking sensation in her neck or pressure in any position or when lying down.  She has had an ultrasound exam in 11/2015 which showed the following  LEFT lobe: Hypoechoic solid left midpole nodule measures 21 x 16 x 14 mm.  Echogenic partially cystic nodule in the lower pole measures 22 x 16 x 17 mm.  COMPARISON from 8/17 ultrasound: Findings of 2.2 cm mid right, 2.1 cm upper left, and 2.3 cm lower left nodules   The right lobe showed: Solid midpole nodule with coarse calcification measures 13 mm in greatest dimension. Mid to lower pole large dystrophic calcification measures 15 x 10 x12 mm with posterior shadowing. Hypoechoic partially cystic nodule in the right lower pole measures10 x 8 x 9 mm.  COMPARISON from 8/17: Poorly marginated 2.2 x 1.3 x 1.6 cm nodule with heavy coarse calcifications, mid lobe (previously 1.4 x 1 x 1.1 cm).  Her thyroid functions have been normal  Lab Results  Component Value Date   TSH 1.05 05/23/2016   TSH 1.322 10/24/2015   TSH 1.260 07/27/2015   FREET4 1.21 05/23/2016       Medication List       Accurate as of 05/31/16  9:41 AM. Always use your most recent med list.          acyclovir 400 MG tablet Commonly known as:  ZOVIRAX Take 1 tablet (400 mg total) by mouth daily.   amoxicillin-clavulanate 875-125 MG tablet Commonly known as:  AUGMENTIN Take 1 tablet by mouth 2 (two) times daily. Finish this course as decided previously, total 10 days.   BIOTIN PO Take 1 tablet by mouth daily. Reported on 11/30/2015   CALCIUM PO Take 1 tablet by mouth daily.   CO Q 10 PO Take 1  tablet by mouth daily.   fish oil-omega-3 fatty acids 1000 MG capsule Take 1 g by mouth daily.   gabapentin 400 MG capsule Commonly known as:  NEURONTIN Take 400 mg by mouth 2 (two) times daily.   GINGER ROOT PO Take 1 capsule by mouth daily. Reported on 11/30/2015   multivitamin tablet Take 1 tablet by mouth daily.   NON FORMULARY Take 1 capsule by mouth daily. Primrose oil daily   nortriptyline 50 MG capsule Commonly known as:  PAMELOR TAKE 1 CAPSULE (50 MG TOTAL) BY MOUTH AT BEDTIME   TURMERIC PO Take 1 capsule by mouth daily. Reported on 11/30/2015       Allergies:  Allergies  Allergen Reactions  . Sulfa Antibiotics Swelling    Past Medical History:  Diagnosis Date  . Abnormal finding on Pap smear    Cervicitis  . Arthritis   . Elevated LFTs   . Endometrial polyp   . Gallstones   . GERD (gastroesophageal reflux disease)   . Herpes progenitalis   . MVP (mitral valve prolapse)    antibiotic for dental procedures    Past Surgical History:  Procedure Laterality Date  . CERVICAL FUSION    . COLPOSCOPY    . DILATION AND CURETTAGE OF UTERUS    .  HYSTEROSCOPY    . LAPAROSCOPIC CHOLECYSTECTOMY  09/03/2012  . NASAL SEPTUM SURGERY    . NASAL SEPTUM SURGERY  1974  . SHOULDER SURGERY    . TONSILLECTOMY    . TONSILLECTOMY  1965  . UTERINE FIBROID SURGERY  2005    Family History  Problem Relation Age of Onset  . Adopted: Yes  . Colon cancer Mother   . Hypertension Mother   . Osteoporosis Mother   . Cancer Mother   . Heart disease Sister   . Diabetes Maternal Grandmother   . Heart disease Maternal Grandfather   . Esophageal cancer Neg Hx   . Rectal cancer Neg Hx   . Stomach cancer Neg Hx     Social History:  reports that she has never smoked. She has never used smokeless tobacco. She reports that she drinks alcohol. She reports that she does not use drugs.   Review of Systems:  Review of Systems      Examination:   BP 124/78   Pulse 95   Ht  5\' 5"  (1.651 m)   Wt 159 lb (72.1 kg)   SpO2 99%   BMI 26.46 kg/m    General Appearance: She looks well,          THYROID: Right lobe is about 2 times normal,  irregular and firm without separated nodule. Left lobe is smoother and firm, again no distinct nodules felt, more prominent medially on swallowing   There is no lymphadenopathy.        Assessment/Plan:  Multinodular goiter with multiple nodules on each side, discovered incidentally on CT scan done for other reasons.  Currently the left-sided nodules appear to be unchanged since February The description of the right-sided nodule is different on the ultrasound and is appears to be more indistinct but overall larger indicating possible degeneration Discussed with  the patient that dystrophic calcification is not a sign of malignancy  Although it may be renal to continue to follow with serial ultrasounds the patient is very uncomfortable not knowing her diagnosis and wants to get a biopsy done and will schedule this  [facility] 05/31/2016

## 2016-06-01 DIAGNOSIS — E042 Nontoxic multinodular goiter: Secondary | ICD-10-CM | POA: Insufficient documentation

## 2016-06-06 ENCOUNTER — Telehealth: Payer: Self-pay | Admitting: Endocrinology

## 2016-06-06 NOTE — Telephone Encounter (Signed)
[facility] needs help with the orders for the pt's nodules, she has 3 and each one needs a separate order

## 2016-06-07 NOTE — Telephone Encounter (Signed)
Please see below.

## 2016-06-12 ENCOUNTER — Other Ambulatory Visit: Payer: Self-pay | Admitting: Endocrinology

## 2016-06-12 DIAGNOSIS — E041 Nontoxic single thyroid nodule: Secondary | ICD-10-CM

## 2016-06-12 NOTE — Telephone Encounter (Signed)
done

## 2016-07-02 HISTORY — PX: BIOPSY THYROID: PRO38

## 2016-07-04 ENCOUNTER — Other Ambulatory Visit (HOSPITAL_COMMUNITY)
Admission: RE | Admit: 2016-07-04 | Discharge: 2016-07-04 | Disposition: A | Discharge status: Home / Self Care | Payer: 59 | Source: Ambulatory Visit | Attending: Radiology | Admitting: Radiology

## 2016-07-04 ENCOUNTER — Ambulatory Visit
Admission: RE | Admit: 2016-07-04 | Discharge: 2016-07-04 | Disposition: A | Discharge status: Home / Self Care | Payer: 59 | Source: Ambulatory Visit | Attending: Endocrinology | Admitting: Endocrinology

## 2016-07-04 DIAGNOSIS — E041 Nontoxic single thyroid nodule: Secondary | ICD-10-CM

## 2016-07-04 DIAGNOSIS — E042 Nontoxic multinodular goiter: Secondary | ICD-10-CM

## 2016-07-06 NOTE — Progress Notes (Signed)
Please let patient know that the biopsy is benign and no further action needed

## 2016-07-27 ENCOUNTER — Ambulatory Visit (INDEPENDENT_AMBULATORY_CARE_PROVIDER_SITE_OTHER): Payer: 59 | Admitting: Gynecology

## 2016-07-27 ENCOUNTER — Encounter: Payer: Self-pay | Admitting: Gynecology

## 2016-07-27 ENCOUNTER — Encounter: Payer: 59 | Admitting: Gynecology

## 2016-07-27 VITALS — BP 130/80 | Ht 65.0 in | Wt 162.4 lb

## 2016-07-27 DIAGNOSIS — Z01419 Encounter for gynecological examination (general) (routine) without abnormal findings: Secondary | ICD-10-CM | POA: Diagnosis not present

## 2016-07-27 DIAGNOSIS — M8580 Other specified disorders of bone density and structure, unspecified site: Secondary | ICD-10-CM | POA: Diagnosis not present

## 2016-07-27 LAB — CBC WITH DIFFERENTIAL/PLATELET
Basophils Absolute: 0 cells/uL (ref 0–200)
Basophils Relative: 0 %
EOS PCT: 2 %
Eosinophils Absolute: 112 cells/uL (ref 15–500)
HEMATOCRIT: 41.4 % (ref 35.0–45.0)
Hemoglobin: 13.8 g/dL (ref 11.7–15.5)
LYMPHS PCT: 18 %
Lymphs Abs: 1008 cells/uL (ref 850–3900)
MCH: 30.5 pg (ref 27.0–33.0)
MCHC: 33.3 g/dL (ref 32.0–36.0)
MCV: 91.6 fL (ref 80.0–100.0)
MONO ABS: 448 {cells}/uL (ref 200–950)
MPV: 11.1 fL (ref 7.5–12.5)
Monocytes Relative: 8 %
NEUTROS PCT: 72 %
Neutro Abs: 4032 cells/uL (ref 1500–7800)
Platelets: 266 10*3/uL (ref 140–400)
RBC: 4.52 MIL/uL (ref 3.80–5.10)
RDW: 13 % (ref 11.0–15.0)
WBC: 5.6 10*3/uL (ref 3.8–10.8)

## 2016-07-27 MED ORDER — ACYCLOVIR 400 MG PO TABS: 400.0000 mg | ORAL_TABLET | Freq: Every day | ORAL | 10 refills | Status: DC

## 2016-07-27 NOTE — Progress Notes (Signed)
Emily Brandt 04/17/1956 [id number]   History:    60 y.o.  for annual gyn exam with no complaints.Many years ago she had been on hormone replacement therapy. Patient had found out that her biological mother had history of colon cancer. Patient had a normal colonoscopy in 2014. Patient recently had her flu vaccine. She had an abnormal Pap smear in 2003 consistent with low-grade cervical dysplasia. Colposcopy with biopsy failed to confirm it. Since then she's had normal Pap smears. She had a normal Pap smear  In 2015.She takes Valtrex daily for HSV suppression. Patient's bone density study with history of osteopenia. Patient has been followed by her medical endocrinologist as has result of her multinodular goiter. She had biopsy aspiration this October and benign follicular nodule or aspirated benign pathology.Patient many years ago had spinal fusion and has some radiculopathy and has been followed by the neurologist at [facility] which has her on Neurontin. Patient on no hormone replacement therapy.   Past medical history,surgical history, family history and social history were all reviewed and documented in the EPIC chart.  Gynecologic History No LMP recorded. Patient is postmenopausal. Contraception: post menopausal status Last Pap: 2012 2015. Results were: normal Last mammogram: 2017. Results were: normal  Obstetric History OB History  Gravida Para Term Preterm AB Living  1       1 0  SAB TAB Ectopic Multiple Live Births  1            # Outcome Date GA Lbr Len/2nd Weight Sex Delivery Anes PTL Lv  1 SAB                ROS: A ROS was performed and pertinent positives and negatives are included in the history.  GENERAL: No fevers or chills. HEENT: No change in vision, no earache, sore throat or sinus congestion. NECK: No pain or stiffness. CARDIOVASCULAR: No chest pain or pressure. No palpitations. PULMONARY: No shortness of breath, cough or wheeze. GASTROINTESTINAL: No  abdominal pain, nausea, vomiting or diarrhea, melena or bright red blood per rectum. GENITOURINARY: No urinary frequency, urgency, hesitancy or dysuria. MUSCULOSKELETAL: No joint or muscle pain, no back pain, no recent trauma. DERMATOLOGIC: No rash, no itching, no lesions. ENDOCRINE: No polyuria, polydipsia, no heat or cold intolerance. No recent change in weight. HEMATOLOGICAL: No anemia or easy bruising or bleeding. NEUROLOGIC: No headache, seizures, numbness, tingling or weakness. PSYCHIATRIC: No depression, no loss of interest in normal activity or change in sleep pattern.     Exam: chaperone present  BP 130/80   Ht 5\' 5"  (1.651 m)   Wt 162 lb 6.4 oz (73.7 kg)   BMI 27.02 kg/m   Body mass index is 27.02 kg/m.  General appearance : Well developed well nourished female. No acute distress HEENT: Eyes: no retinal hemorrhage or exudates,  Neck supple, trachea midline, no carotid bruits, no thyroidmegaly Lungs: Clear to auscultation, no rhonchi or wheezes, or rib retractions  Heart: Regular rate and rhythm, no murmurs or gallops Breast:Examined in sitting and supine position were symmetrical in appearance, no palpable masses or tenderness,  no skin retraction, no nipple inversion, no nipple discharge, no skin discoloration, no axillary or supraclavicular lymphadenopathy Abdomen: no palpable masses or tenderness, no rebound or guarding Extremities: no edema or skin discoloration or tenderness  Pelvic:  Bartholin, Urethra, Skene Glands: Within normal limits             Vagina: No gross lesions or discharge  Cervix: No gross  lesions or discharge  Uterus  anteverted, normal size, shape and consistency, non-tender and mobile  Adnexa  Without masses or tenderness  Anus and perineum  normal   Rectovaginal  normal sphincter tone without palpated masses or tenderness             Hemoccult PCP provides     Assessment/Plan:  60 y.o. female for annual exam scheduled for next bone density study  next year. We discussed importance of calcium vitamin D and weightbearing exercises for osteoporosis prevention. She also should be checking with her PCP weight is her next colonoscopy since she has history of colon cancer in her mother and she had normal colonoscopy in 2014. Pap smear was not done today according to new guidelines. PCP has done her blood work and her vaccines are up-to-date.   Uvaldo Rising H MD, 1:25 PM 07/27/2016

## 2016-07-28 ENCOUNTER — Other Ambulatory Visit: Payer: Self-pay | Admitting: Gynecology

## 2016-07-28 DIAGNOSIS — R945 Abnormal results of liver function studies: Principal | ICD-10-CM

## 2016-07-28 DIAGNOSIS — R7989 Other specified abnormal findings of blood chemistry: Secondary | ICD-10-CM

## 2016-07-28 LAB — COMPREHENSIVE METABOLIC PANEL
ALT: 60 U/L — ABNORMAL HIGH (ref 6–29)
AST: 44 U/L — AB (ref 10–35)
Albumin: 4.3 g/dL (ref 3.6–5.1)
Alkaline Phosphatase: 75 U/L (ref 33–130)
BILIRUBIN TOTAL: 0.6 mg/dL (ref 0.2–1.2)
BUN: 15 mg/dL (ref 7–25)
CALCIUM: 9.7 mg/dL (ref 8.6–10.4)
CO2: 27 mmol/L (ref 20–31)
Chloride: 105 mmol/L (ref 98–110)
Creat: 0.81 mg/dL (ref 0.50–0.99)
GLUCOSE: 83 mg/dL (ref 65–99)
Potassium: 4 mmol/L (ref 3.5–5.3)
Sodium: 141 mmol/L (ref 135–146)
Total Protein: 6.9 g/dL (ref 6.1–8.1)

## 2016-07-28 LAB — URINALYSIS W MICROSCOPIC + REFLEX CULTURE
BACTERIA UA: NONE SEEN [HPF]
Bilirubin Urine: NEGATIVE
CASTS: NONE SEEN [LPF]
CRYSTALS: NONE SEEN [HPF]
Glucose, UA: NEGATIVE
Hgb urine dipstick: NEGATIVE
Ketones, ur: NEGATIVE
LEUKOCYTES UA: NEGATIVE
Nitrite: NEGATIVE
PROTEIN: NEGATIVE
RBC / HPF: NONE SEEN RBC/HPF (ref <=2)
Specific Gravity, Urine: 1.021 (ref 1.001–1.035)
Squamous Epithelial / LPF: NONE SEEN [HPF] (ref <=5)
WBC, UA: NONE SEEN WBC/HPF (ref <=5)
YEAST: NONE SEEN [HPF]
pH: 6.5 (ref 5.0–8.0)

## 2016-07-28 LAB — LIPID PANEL
CHOL/HDL RATIO: 2.6 ratio (ref <=5.0)
Cholesterol: 166 mg/dL (ref 125–200)
HDL: 63 mg/dL (ref >=46)
LDL CALC: 80 mg/dL (ref <130)
Triglycerides: 114 mg/dL (ref <150)
VLDL: 23 mg/dL (ref <30)

## 2016-07-28 LAB — VITAMIN D 25 HYDROXY (VIT D DEFICIENCY, FRACTURES): VIT D 25 HYDROXY: 35 ng/mL (ref 30–100)

## 2016-08-11 ENCOUNTER — Other Ambulatory Visit: Payer: 59

## 2016-08-11 DIAGNOSIS — R7989 Other specified abnormal findings of blood chemistry: Secondary | ICD-10-CM

## 2016-08-11 DIAGNOSIS — R945 Abnormal results of liver function studies: Principal | ICD-10-CM

## 2016-08-11 LAB — AST: AST: 26 U/L (ref 10–35)

## 2016-08-11 LAB — ALT: ALT: 23 U/L (ref 6–29)

## 2016-08-14 ENCOUNTER — Telehealth: Payer: Self-pay | Admitting: *Deleted

## 2016-08-14 NOTE — Telephone Encounter (Signed)
Every other day

## 2016-08-14 NOTE — Telephone Encounter (Signed)
Pt informed with the below. 

## 2016-08-14 NOTE — Telephone Encounter (Signed)
-----   Message from Terrance Mass, MD sent at 08/13/2016  5:41 PM EST ----- Please inform patient her liver function tests have return back to normal

## 2016-08-14 NOTE — Telephone Encounter (Signed)
Pt informed with the below note, pt asked should she stay off the omega 3 fish oil supplements? Since back to normal? Please advise

## 2016-11-08 ENCOUNTER — Other Ambulatory Visit: Payer: Self-pay | Admitting: Gynecology

## 2016-11-08 DIAGNOSIS — Z1231 Encounter for screening mammogram for malignant neoplasm of breast: Secondary | ICD-10-CM

## 2016-11-29 ENCOUNTER — Encounter: Payer: Self-pay | Admitting: Endocrinology

## 2016-11-29 ENCOUNTER — Ambulatory Visit (INDEPENDENT_AMBULATORY_CARE_PROVIDER_SITE_OTHER): Payer: 59 | Admitting: Endocrinology

## 2016-11-29 VITALS — BP 132/82 | HR 91 | Ht 68.0 in | Wt 168.0 lb

## 2016-11-29 DIAGNOSIS — E042 Nontoxic multinodular goiter: Secondary | ICD-10-CM | POA: Diagnosis not present

## 2016-11-29 NOTE — Progress Notes (Signed)
Patient ID: Emily Brandt, female   DOB: Nov 14, 1955, 61 y.o.   MRN: [id number]           Reason for Appointment: Follow-up of multiple nodules    History of Present Illness:   The patient's thyroid enlargement was first discovered in 1/17 incidentally when she was having a CT scan  She has had no difficulty with swallowing   Does not feel like she has any choking sensation in her neck or pressure in any position or when lying down.  She has had an ultrasound exam in 11/2015 which showed the following  LEFT lobe: Hypoechoic solid left midpole nodule measures 21 x 16 x 14 mm.  Echogenic partially cystic nodule in the lower pole measures 22 x 16 x 17 mm.  COMPARISON from 8/17 ultrasound: Findings of 2.2 cm mid right, 2.1 cm upper left, and 2.3 cm lower left nodules   The right lobe showed: Solid midpole nodule with coarse calcification measures 13 mm in greatest dimension. Mid to lower pole large dystrophic calcification measures 15 x 10 x12 mm with posterior shadowing. Hypoechoic partially cystic nodule in the right lower pole measures10 x 8 x 9 mm.  COMPARISON from 8/17: Poorly marginated 2.2 x 1.3 x 1.6 cm nodule with heavy coarse calcifications, mid lobe (previously 1.4 x 1 x 1.1 cm).  Since there was a change in the size of the right-sided nodule patient was wanting to have this biopsied and result was benign  Her thyroid functions have been normal  Lab Results  Component Value Date   TSH 1.05 05/23/2016   TSH 1.322 10/24/2015   TSH 1.260 07/27/2015   FREET4 1.21 05/23/2016     Allergies as of 11/29/2016      Reactions   Sulfa Antibiotics Swelling      Medication List       Accurate as of 11/29/16  9:31 AM. Always use your most recent med list.          acyclovir 400 MG tablet Commonly known as:  ZOVIRAX Take 1 tablet (400 mg total) by mouth daily.   amoxicillin-clavulanate 875-125 MG tablet Commonly known as:  AUGMENTIN Take 1 tablet by mouth 2 (two)  times daily. Finish this course as decided previously, total 10 days.   BIOTIN PO Take 1 tablet by mouth daily. Reported on 11/30/2015   CALCIUM PO Take 1 tablet by mouth daily.   CO Q 10 PO Take 1 tablet by mouth daily.   fish oil-omega-3 fatty acids 1000 MG capsule Take 1 g by mouth daily.   gabapentin 400 MG capsule Commonly known as:  NEURONTIN Take 400 mg by mouth 2 (two) times daily.   GINGER ROOT PO Take 1 capsule by mouth daily. Reported on 11/30/2015   multivitamin tablet Take 1 tablet by mouth daily.   NON FORMULARY Take 1 capsule by mouth daily. Primrose oil daily   nortriptyline 50 MG capsule Commonly known as:  PAMELOR TAKE 1 CAPSULE (50 MG TOTAL) BY MOUTH AT BEDTIME   TURMERIC PO Take 1 capsule by mouth daily. Reported on 11/30/2015       Allergies:  Allergies  Allergen Reactions  . Sulfa Antibiotics Swelling    Past Medical History:  Diagnosis Date  . Abnormal finding on Pap smear    Cervicitis  . Arthritis   . Elevated LFTs   . Endometrial polyp   . Gallstones   . GERD (gastroesophageal reflux disease)   . Herpes progenitalis   .  MVP (mitral valve prolapse)    antibiotic for dental procedures    Past Surgical History:  Procedure Laterality Date  . BIOPSY THYROID  07/2016  . CERVICAL FUSION    . COLPOSCOPY    . DILATION AND CURETTAGE OF UTERUS    . HYSTEROSCOPY    . LAPAROSCOPIC CHOLECYSTECTOMY  09/03/2012  . NASAL SEPTUM SURGERY    . NASAL SEPTUM SURGERY  1974  . SHOULDER SURGERY    . TONSILLECTOMY    . TONSILLECTOMY  1965  . UTERINE FIBROID SURGERY  2005    Family History  Problem Relation Age of Onset  . Adopted: Yes  . Colon cancer Mother   . Hypertension Mother   . Osteoporosis Mother   . Cancer Mother   . Heart disease Sister   . Diabetes Maternal Grandmother   . Heart disease Maternal Grandfather   . Esophageal cancer Neg Hx   . Rectal cancer Neg Hx   . Stomach cancer Neg Hx     Social History:  reports that she  has never smoked. She has never used smokeless tobacco. She reports that she drinks alcohol. She reports that she does not use drugs.   Review of Systems:  Review of Systems    Examination:   BP 132/82   Pulse 91   Ht 5\' 8"  (1.727 m)   Wt 168 lb (76.2 kg)   SpO2 97%   BMI 25.54 kg/m  ,          THYROID: Right lobe is about 1.5 times normal,  relatively flat and firm, no significant nodule felt Left lobe is about 1.5-2 times normal and firm, again no distinct nodules felt   There is no lymphadenopathy.        Assessment/Plan:  Multinodular goiter with multiple nodules on each side, discovered incidentally on CT scan done for other reasons.  On today's exam the right-sided thyroid enlargement appears sniffing and smaller than before Left lobe is about the same as before  Reassured her that since the thyroid size or smaller and the biopsy was benign on the right side this is not a concern now Also left side is stable on exam is also on previous ultrasound there was no change in the size of the nodules  We will have her get 1 more ultrasound in one year and if stable will not need any further radiological follow-up  [facility] 11/29/2016

## 2016-12-25 ENCOUNTER — Ambulatory Visit: Payer: 59

## 2016-12-27 ENCOUNTER — Ambulatory Visit
Admission: RE | Admit: 2016-12-27 | Discharge: 2016-12-27 | Disposition: A | Discharge status: Home / Self Care | Payer: 59 | Source: Ambulatory Visit | Attending: Gynecology | Admitting: Gynecology

## 2016-12-27 DIAGNOSIS — Z1231 Encounter for screening mammogram for malignant neoplasm of breast: Secondary | ICD-10-CM

## 2017-01-22 DIAGNOSIS — M5412 Radiculopathy, cervical region: Secondary | ICD-10-CM | POA: Diagnosis not present

## 2017-01-22 DIAGNOSIS — G5602 Carpal tunnel syndrome, left upper limb: Secondary | ICD-10-CM | POA: Diagnosis not present

## 2017-02-14 ENCOUNTER — Encounter: Payer: Self-pay | Admitting: Gynecology

## 2017-05-15 DIAGNOSIS — M8589 Other specified disorders of bone density and structure, multiple sites: Secondary | ICD-10-CM | POA: Insufficient documentation

## 2017-05-15 DIAGNOSIS — M19042 Primary osteoarthritis, left hand: Principal | ICD-10-CM

## 2017-05-15 DIAGNOSIS — M170 Bilateral primary osteoarthritis of knee: Secondary | ICD-10-CM | POA: Insufficient documentation

## 2017-05-15 DIAGNOSIS — M160 Bilateral primary osteoarthritis of hip: Secondary | ICD-10-CM | POA: Insufficient documentation

## 2017-05-15 DIAGNOSIS — M5030 Other cervical disc degeneration, unspecified cervical region: Secondary | ICD-10-CM | POA: Insufficient documentation

## 2017-05-15 DIAGNOSIS — M19041 Primary osteoarthritis, right hand: Secondary | ICD-10-CM | POA: Insufficient documentation

## 2017-05-15 DIAGNOSIS — M5136 Other intervertebral disc degeneration, lumbar region: Secondary | ICD-10-CM | POA: Insufficient documentation

## 2017-05-15 DIAGNOSIS — Z8639 Personal history of other endocrine, nutritional and metabolic disease: Secondary | ICD-10-CM | POA: Insufficient documentation

## 2017-05-15 DIAGNOSIS — R768 Other specified abnormal immunological findings in serum: Secondary | ICD-10-CM | POA: Insufficient documentation

## 2017-05-15 NOTE — Progress Notes (Signed)
Office Visit Note  Patient: Emily Brandt             Date of Birth: Aug 08, 1956           MRN: [id number]             PCP: Emily Shelter, PA-C Referring: Emily Shelter, PA-C Visit Date: 05/21/2017 Occupation: @GUAROCC @    Subjective:  Follow-up (1 yr f/u,  hands still bother her worse on warm days better on cool days)   History of Present Illness: Emily Brandt is a 61 y.o. female with history of osteoarthritis and disc disease. She states she has some discomfort in her hands. She continues to have some discomfort in her knee joints. Her hips joints are doing better since she started doing Pilates. She has more pain in her neck area. The lower back is doing okay.  Activities of Daily Living:  Patient reports morning stiffness for 5 minutes.   Patient Denies nocturnal pain.  Difficulty dressing/grooming: Denies Difficulty climbing stairs: Denies Difficulty getting out of chair: Denies Difficulty using hands for taps, buttons, cutlery, and/or writing: Denies   Review of Systems  Constitutional: Negative for fatigue, night sweats, weight gain, weight loss and weakness.  HENT: Negative for mouth sores, trouble swallowing, trouble swallowing, mouth dryness and nose dryness.   Eyes: Negative for pain, redness, visual disturbance and dryness.  Respiratory: Negative for cough, shortness of breath and difficulty breathing.   Cardiovascular: Negative for chest pain, palpitations, hypertension, irregular heartbeat and swelling in legs/feet.  Gastrointestinal: Negative for blood in stool, constipation and diarrhea.  Endocrine: Negative for increased urination.  Genitourinary: Negative for vaginal dryness.  Musculoskeletal: Positive for arthralgias, joint pain and morning stiffness. Negative for joint swelling, myalgias, muscle weakness, muscle tenderness and myalgias.  Skin: Negative for color change, rash, hair loss, skin tightness, ulcers and sensitivity to sunlight.    Allergic/Immunologic: Negative for susceptible to infections.  Neurological: Negative for dizziness, memory loss and night sweats.  Hematological: Negative for swollen glands.  Psychiatric/Behavioral: Negative for depressed mood and sleep disturbance. The patient is not nervous/anxious.     PMFS History:  Patient Active Problem List   Diagnosis Date Noted  . Primary osteoarthritis of both hands 05/15/2017  . Primary osteoarthritis of both hips 05/15/2017  . Primary osteoarthritis of both knees 05/15/2017  . DDD (degenerative disc disease), cervical s/p cervical fusion 05/15/2017  . DDD (degenerative disc disease), lumbar 05/15/2017  . ANA positive has had no clinical features of autoimmune disease  05/15/2017  . Osteopenia of multiple sites 05/15/2017  . History of goiter 05/15/2017  . Multinodular goiter 06/01/2016  . 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 Preseptal cellulitis 10/24/2015  . Abnormal finding on Pap smear   . Herpes progenitalis   . MVP (mitral valve prolapse)     Past Medical History:  Diagnosis Date  . Abnormal finding on Pap smear    Cervicitis  . Arthritis   . Elevated LFTs   . Endometrial polyp   . Gallstones   . GERD (gastroesophageal reflux disease)   . Herpes progenitalis   . MVP (mitral valve prolapse)    antibiotic for dental procedures    Family History  Problem Relation Age of Onset  . Adopted: Yes  . Colon cancer Mother   . Hypertension Mother   . Osteoporosis Mother   . Cancer Mother   . Kidney failure Father   . Heart disease Sister   . Osteoporosis Sister   .  Diabetes Maternal Grandmother   . Heart disease Maternal Grandfather   . Esophageal cancer Neg Hx   . Rectal cancer Neg Hx   . Stomach cancer Neg Hx   . Breast cancer Neg Hx    Past Surgical History:  Procedure Laterality Date  . BIOPSY THYROID  07/2016  . CERVICAL FUSION    . COLPOSCOPY    . DILATION AND CURETTAGE OF UTERUS    . HYSTEROSCOPY    . LAPAROSCOPIC  CHOLECYSTECTOMY  09/03/2012  . NASAL SEPTUM SURGERY    . NASAL SEPTUM SURGERY  1974  . SHOULDER SURGERY    . TONSILLECTOMY    . TONSILLECTOMY  1965  . UTERINE FIBROID SURGERY  2005   Social History   Social History Narrative  . No narrative on file     Objective: Vital Signs: BP 136/77 (BP Location: Left Arm, Patient Position: Sitting, Cuff Size: Normal)   Pulse 87   Ht 5\' 5"  (1.651 m)   Wt 160 lb (72.6 kg)   BMI 26.63 kg/m    Physical Exam  Constitutional: She is oriented to person, place, and time. She appears well-developed and well-nourished.  HENT:  Head: Normocephalic and atraumatic.  Eyes: Conjunctivae and EOM are normal.  Neck: Normal range of motion.  Cardiovascular: Normal rate, regular rhythm, normal heart sounds and intact distal pulses.   Pulmonary/Chest: Effort normal and breath sounds normal.  Abdominal: Soft. Bowel sounds are normal.  Lymphadenopathy:    She has no cervical adenopathy.  Neurological: She is alert and oriented to person, place, and time.  Skin: Skin is warm and dry. Capillary refill takes less than 2 seconds.  Psychiatric: She has a normal mood and affect. Her behavior is normal.  Nursing note and vitals reviewed.    Musculoskeletal Exam: C-spine some limitation with range of motion. Thoracic and lumbar spine some limitation of range of motion. Not much discomfort. Shoulder joints, elbow joints, wrist joints are good range of motion. She has DIP PIP thickening in her bilateral hands especially the left third PIP which had injury in the past she has decreased range of motion. No synovitis was noted. Hip joints knee joints ankles MTPs PIPs with good range of motion with no synovitis.  CDAI Exam: No CDAI exam completed.    Investigation: No additional findings.   Imaging: No results found.  Speciality Comments: No specialty comments available.    Procedures:  No procedures performed Allergies: Sulfa antibiotics   Assessment /  Plan:     Visit Diagnoses: Primary osteoarthritis of both hands - mild. She has some DIP PIP thickening but no synovitis. Joint protection and muscle strengthening was demonstrated in the office today.  Primary osteoarthritis of both hips - mild: Doing well  Primary osteoarthritis of both knees - moderate: She does have chronic pain. Weight loss diet and exercise was discussed.  DDD (degenerative disc disease), cervical s/p cervical fusion: She has chronic discomfort.  DDD (degenerative disc disease), lumbar: Doing better after starting exercises.  ANA positive has had no clinical features of autoimmune disease  - 1:160. She has no clinical features of autoimmune disease.  Her other medical problems are listed as follows:  Osteopenia of multiple sites  History of goiter  History of cholecystectomy    Orders: No orders of the defined types were placed in this encounter.  No orders of the defined types were placed in this encounter.    Follow-Up Instructions: Return if symptoms worsen or fail to improve,  for Osteoarthritis DDD.   Bo Merino, MD  Note - This record has been created using Editor, commissioning.  Chart creation errors have been sought, but may not always  have been located. Such creation errors do not reflect on  the standard of medical care.

## 2017-05-17 ENCOUNTER — Ambulatory Visit: Payer: 59 | Admitting: Rheumatology

## 2017-05-21 ENCOUNTER — Encounter: Payer: Self-pay | Admitting: Rheumatology

## 2017-05-21 ENCOUNTER — Ambulatory Visit (INDEPENDENT_AMBULATORY_CARE_PROVIDER_SITE_OTHER): Payer: 59 | Admitting: Rheumatology

## 2017-05-21 VITALS — BP 136/77 | HR 87 | Ht 65.0 in | Wt 160.0 lb

## 2017-05-21 DIAGNOSIS — M8589 Other specified disorders of bone density and structure, multiple sites: Secondary | ICD-10-CM

## 2017-05-21 DIAGNOSIS — M19042 Primary osteoarthritis, left hand: Secondary | ICD-10-CM

## 2017-05-21 DIAGNOSIS — R768 Other specified abnormal immunological findings in serum: Secondary | ICD-10-CM | POA: Diagnosis not present

## 2017-05-21 DIAGNOSIS — M170 Bilateral primary osteoarthritis of knee: Secondary | ICD-10-CM | POA: Diagnosis not present

## 2017-05-21 DIAGNOSIS — Z8639 Personal history of other endocrine, nutritional and metabolic disease: Secondary | ICD-10-CM

## 2017-05-21 DIAGNOSIS — M160 Bilateral primary osteoarthritis of hip: Secondary | ICD-10-CM

## 2017-05-21 DIAGNOSIS — M5136 Other intervertebral disc degeneration, lumbar region: Secondary | ICD-10-CM | POA: Diagnosis not present

## 2017-05-21 DIAGNOSIS — M5030 Other cervical disc degeneration, unspecified cervical region: Secondary | ICD-10-CM

## 2017-05-21 DIAGNOSIS — M19041 Primary osteoarthritis, right hand: Secondary | ICD-10-CM

## 2017-05-21 DIAGNOSIS — Z9049 Acquired absence of other specified parts of digestive tract: Secondary | ICD-10-CM | POA: Diagnosis not present

## 2017-05-21 NOTE — Patient Instructions (Signed)
Hand Exercises Hand exercises can be helpful to almost anyone. These exercises can strengthen the hands, improve flexibility and movement, and increase blood flow to the hands. These results can make work and daily tasks easier. Hand exercises can be especially helpful for people who have joint pain from arthritis or have nerve damage from overuse (carpal tunnel syndrome). These exercises can also help people who have injured a hand. Most of these hand exercises are fairly gentle stretching routines. You can do them often throughout the day. Still, it is a good idea to ask your health care provider which exercises would be best for you. Warming your hands before exercise may help to reduce stiffness. You can do this with gentle massage or by placing your hands in warm water for 15 minutes. Also, make sure you pay attention to your level of hand pain as you begin an exercise routine. Exercises Knuckle Bend Repeat this exercise 5-10 times with each hand. 1. Stand or sit with your arm, hand, and all five fingers pointed straight up. Make sure your wrist is straight. 2. Gently and slowly bend your fingers down and inward until the tips of your fingers are touching the tops of your palm. 3. Hold this position for a few seconds. 4. Extend your fingers out to their original position, all pointing straight up again.  Finger Fan Repeat this exercise 5-10 times with each hand. 1. Hold your arm and hand out in front of you. Keep your wrist straight. 2. Squeeze your hand into a fist. 3. Hold this position for a few seconds. 4. Fan out, or spread apart, your hand and fingers as much as possible, stretching every joint fully.  Tabletop Repeat this exercise 5-10 times with each hand. 1. Stand or sit with your arm, hand, and all five fingers pointed straight up. Make sure your wrist is straight. 2. Gently and slowly bend your fingers at the knuckles where they meet the hand until your hand is making an  upside-down L shape. Your fingers should form a tabletop. 3. Hold this position for a few seconds. 4. Extend your fingers out to their original position, all pointing straight up again.  Making Os Repeat this exercise 5-10 times with each hand. 1. Stand or sit with your arm, hand, and all five fingers pointed straight up. Make sure your wrist is straight. 2. Make an O shape by touching your pointer finger to your thumb. Hold for a few seconds. Then open your hand wide. 3. Repeat this motion with each finger on your hand.  Table Spread Repeat this exercise 5-10 times with each hand. 1. Place your hand on a table with your palm facing down. Make sure your wrist is straight. 2. Spread your fingers out as much as possible. Hold this position for a few seconds. 3. Slide your fingers back together again. Hold for a few seconds.  Ball Grip  Repeat this exercise 10-15 times with each hand. 1. Hold a tennis ball or another soft ball in your hand. 2. While slowly increasing pressure, squeeze the ball as hard as possible. 3. Squeeze as hard as you can for 3-5 seconds. 4. Relax and repeat.  Wrist Curls Repeat this exercise 10-15 times with each hand. 1. Sit in a chair that has armrests. 2. Hold a light weight in your hand, such as a dumbbell that weighs 1-3 pounds (0.5-1.4 kg). Ask your health care provider what weight would be best for you. 3. Rest your hand just over   the end of the chair arm with your palm facing up. 4. Gently pivot your wrist up and down while holding the weight. Do not twist your wrist from side to side.  Contact a health care provider if:  Your hand pain or discomfort gets much worse when you do an exercise.  Your hand pain or discomfort does not improve within 2 hours after you exercise. If you have any of these problems, stop doing these exercises right away. Do not do them again unless your health care provider says that you can. Get help right away if:  You  develop sudden, severe hand pain. If this happens, stop doing these exercises right away. Do not do them again unless your health care provider says that you can. This information is not intended to replace advice given to you by your health care provider. Make sure you discuss any questions you have with your health care provider. Document Released: 08/30/2015 Document Revised: 02/24/2016 Document Reviewed: 03/29/2015 Elsevier Interactive Patient Education  2018 [employer] Knee Exercises Ask your health care provider which exercises are safe for you. Do exercises exactly as told by your health care provider and adjust them as directed. It is normal to feel mild stretching, pulling, tightness, or discomfort as you do these exercises, but you should stop right away if you feel sudden pain or your pain gets worse.Do not begin these exercises until told by your health care provider. STRETCHING AND RANGE OF MOTION EXERCISES These exercises warm up your muscles and joints and improve the movement and flexibility of your knee. These exercises also help to relieve pain, numbness, and tingling. Exercise A: Knee Extension, Prone 5. Lie on your abdomen on a bed. 6. Place your left / right knee just beyond the edge of the surface so your knee is not on the bed. You can put a towel under your left / right thigh just above your knee for comfort. 7. Relax your leg muscles and allow gravity to straighten your knee. You should feel a stretch behind your left / right knee. 8. Hold this position for __________ seconds. 9. Scoot up so your knee is supported between repetitions. Repeat __________ times. Complete this stretch __________ times a day. Exercise B: Knee Flexion, Active  5. Lie on your back with both knees straight. If this causes back discomfort, bend your left / right knee so your foot is flat on the floor. 6. Slowly slide your left / right heel back toward your buttocks until you feel a gentle  stretch in the front of your knee or thigh. 7. Hold this position for __________ seconds. 8. Slowly slide your left / right heel back to the starting position. Repeat __________ times. Complete this exercise __________ times a day. Exercise C: Quadriceps, Prone  5. Lie on your abdomen on a firm surface, such as a bed or padded floor. 6. Bend your left / right knee and hold your ankle. If you cannot reach your ankle or pant leg, loop a belt around your foot and grab the belt instead. 7. Gently pull your heel toward your buttocks. Your knee should not slide out to the side. You should feel a stretch in the front of your thigh and knee. 8. Hold this position for __________ seconds. Repeat __________ times. Complete this stretch __________ times a day. Exercise D: Hamstring, Supine 1. Lie on your back. 2. Loop a belt or towel over the ball of your left / right foot. The ball of   your foot is on the walking surface, right under your toes. 3. Straighten your left / right knee and slowly pull on the belt to raise your leg until you feel a gentle stretch behind your knee. ? Do not let your left / right knee bend while you do this. ? Keep your other leg flat on the floor. 4. Hold this position for __________ seconds. Repeat __________ times. Complete this stretch __________ times a day. STRENGTHENING EXERCISES These exercises build strength and endurance in your knee. Endurance is the ability to use your muscles for a long time, even after they get tired. Exercise E: Quadriceps, Isometric  4. Lie on your back with your left / right leg extended and your other knee bent. Put a rolled towel or small pillow under your knee if told by your health care provider. 5. Slowly tense the muscles in the front of your left / right thigh. You should see your kneecap slide up toward your hip or see increased dimpling just above the knee. This motion will push the back of the knee toward the floor. 6. For __________  seconds, keep the muscle as tight as you can without increasing your pain. 7. Relax the muscles slowly and completely. Repeat __________ times. Complete this exercise __________ times a day. Exercise F: Straight Leg Raises - Quadriceps 5. Lie on your back with your left / right leg extended and your other knee bent. 6. Tense the muscles in the front of your left / right thigh. You should see your kneecap slide up or see increased dimpling just above the knee. Your thigh may even shake a bit. 7. Keep these muscles tight as you raise your leg 4-6 inches (10-15 cm) off the floor. Do not let your knee bend. 8. Hold this position for __________ seconds. 9. Keep these muscles tense as you lower your leg. 10. Relax your muscles slowly and completely after each repetition. Repeat __________ times. Complete this exercise __________ times a day. Exercise G: Hamstring, Isometric 5. Lie on your back on a firm surface. 6. Bend your left / right knee approximately __________ degrees. 7. Dig your left / right heel into the surface as if you are trying to pull it toward your buttocks. Tighten the muscles in the back of your thighs to dig as hard as you can without increasing any pain. 8. Hold this position for __________ seconds. 9. Release the tension gradually and allow your muscles to relax completely for __________ seconds after each repetition. Repeat __________ times. Complete this exercise __________ times a day. Exercise H: Hamstring Curls  If told by your health care provider, do this exercise while wearing ankle weights. Begin with __________ weights. Then increase the weight by 1 lb (0.5 kg) increments. Do not wear ankle weights that are more than __________. 1. Lie on your abdomen with your legs straight. 2. Bend your left / right knee as far as you can without feeling pain. Keep your hips flat against the floor. 3. Hold this position for __________ seconds. 4. Slowly lower your leg to the  starting position.  Repeat __________ times. Complete this exercise __________ times a day. Exercise I: Squats (Quadriceps) 1. Stand in front of a table, with your feet and knees pointing straight ahead. You may rest your hands on the table for balance but not for support. 2. Slowly bend your knees and lower your hips like you are going to sit in a chair. ? Keep your weight over your heels,   not over your toes. ? Keep your lower legs upright so they are parallel with the table legs. ? Do not let your hips go lower than your knees. ? Do not bend lower than told by your health care provider. ? If your knee pain increases, do not bend as low. 3. Hold the squat position for __________ seconds. 4. Slowly push with your legs to return to standing. Do not use your hands to pull yourself to standing. Repeat __________ times. Complete this exercise __________ times a day. Exercise J: Wall Slides (Quadriceps)  1. Lean your back against a smooth wall or door while you walk your feet out 18-24 inches (46-61 cm) from it. 2. Place your feet hip-width apart. 3. Slowly slide down the wall or door until your knees bend __________ degrees. Keep your knees over your heels, not over your toes. Keep your knees in line with your hips. 4. Hold for __________ seconds. Repeat __________ times. Complete this exercise __________ times a day. Exercise K: Straight Leg Raises - Hip Abductors 1. Lie on your side with your left / right leg in the top position. Lie so your head, shoulder, knee, and hip line up. You may bend your bottom knee to help you keep your balance. 2. Roll your hips slightly forward so your hips are stacked directly over each other and your left / right knee is facing forward. 3. Leading with your heel, lift your top leg 4-6 inches (10-15 cm). You should feel the muscles in your outer hip lifting. ? Do not let your foot drift forward. ? Do not let your knee roll toward the ceiling. 4. Hold this  position for __________ seconds. 5. Slowly return your leg to the starting position. 6. Let your muscles relax completely after each repetition. Repeat __________ times. Complete this exercise __________ times a day. Exercise L: Straight Leg Raises - Hip Extensors 1. Lie on your abdomen on a firm surface. You can put a pillow under your hips if that is more comfortable. 2. Tense the muscles in your buttocks and lift your left / right leg about 4-6 inches (10-15 cm). Keep your knee straight as you lift your leg. 3. Hold this position for __________ seconds. 4. Slowly lower your leg to the starting position. 5. Let your leg relax completely after each repetition. Repeat __________ times. Complete this exercise __________ times a day. This information is not intended to replace advice given to you by your health care provider. Make sure you discuss any questions you have with your health care provider. Document Released: 08/02/2005 Document Revised: 06/12/2016 Document Reviewed: 07/25/2015 Elsevier Interactive Patient Education  2018 [employer]  

## 2017-06-27 DIAGNOSIS — Z23 Encounter for immunization: Secondary | ICD-10-CM | POA: Diagnosis not present

## 2017-07-11 ENCOUNTER — Other Ambulatory Visit: Payer: Self-pay | Admitting: *Deleted

## 2017-07-11 MED ORDER — ACYCLOVIR 400 MG PO TABS: 400.0000 mg | ORAL_TABLET | Freq: Every day | ORAL | 0 refills | Status: DC

## 2017-07-30 ENCOUNTER — Ambulatory Visit (INDEPENDENT_AMBULATORY_CARE_PROVIDER_SITE_OTHER): Payer: 59 | Admitting: Obstetrics & Gynecology

## 2017-07-30 ENCOUNTER — Encounter: Payer: Self-pay | Admitting: Obstetrics & Gynecology

## 2017-07-30 ENCOUNTER — Encounter: Payer: 59 | Admitting: Gynecology

## 2017-07-30 ENCOUNTER — Other Ambulatory Visit: Payer: Self-pay | Admitting: Gynecology

## 2017-07-30 VITALS — BP 140/86 | Ht 65.0 in | Wt 156.0 lb

## 2017-07-30 DIAGNOSIS — A6004 Herpesviral vulvovaginitis: Secondary | ICD-10-CM | POA: Diagnosis not present

## 2017-07-30 DIAGNOSIS — Z780 Asymptomatic menopausal state: Secondary | ICD-10-CM

## 2017-07-30 DIAGNOSIS — Z01419 Encounter for gynecological examination (general) (routine) without abnormal findings: Secondary | ICD-10-CM

## 2017-07-30 DIAGNOSIS — M810 Age-related osteoporosis without current pathological fracture: Secondary | ICD-10-CM

## 2017-07-30 DIAGNOSIS — Z13820 Encounter for screening for osteoporosis: Secondary | ICD-10-CM

## 2017-07-30 MED ORDER — ACYCLOVIR 400 MG PO TABS: 400.0000 mg | ORAL_TABLET | Freq: Every day | ORAL | 4 refills | Status: DC

## 2017-07-30 NOTE — Progress Notes (Signed)
Emily Brandt [birth year] [id number]   History:    61 y.o. G1P0A1  RP:  Established patient presenting for annual gyn exam   HPI:  Menopause.  No HRT.  No PMB.  No pelvic pain.  Normal vaginal secretions.  Breasts normal.  History of osteopenia.  History of genital herpes on prophylaxis.  No recurrence x last year.  Mictions normal.  Bowel movements normal.  Past medical history,surgical history, family history and social history were all reviewed and documented in the EPIC chart.  Gynecologic History No LMP recorded. Patient is postmenopausal. Contraception: post menopausal status Last Pap: 2015. Results were: normal Last mammogram: 11/2016. Results were: Negative [facility] 2014 Bone density Osteopenia 08/2015  Obstetric History OB History  Gravida Para Term Preterm AB Living  1       1 0  SAB TAB Ectopic Multiple Live Births  1            # Outcome Date GA Lbr Len/2nd Weight Sex Delivery Anes PTL Lv  1 SAB                ROS: A ROS was performed and pertinent positives and negatives are included in the history.  GENERAL: No fevers or chills. HEENT: No change in vision, no earache, sore throat or sinus congestion. NECK: No pain or stiffness. CARDIOVASCULAR: No chest pain or pressure. No palpitations. PULMONARY: No shortness of breath, cough or wheeze. GASTROINTESTINAL: No abdominal pain, nausea, vomiting or diarrhea, melena or bright red blood per rectum. GENITOURINARY: No urinary frequency, urgency, hesitancy or dysuria. MUSCULOSKELETAL: No joint or muscle pain, no back pain, no recent trauma. DERMATOLOGIC: No rash, no itching, no lesions. ENDOCRINE: No polyuria, polydipsia, no heat or cold intolerance. No recent change in weight. HEMATOLOGICAL: No anemia or easy bruising or bleeding. NEUROLOGIC: No headache, seizures, numbness, tingling or weakness. PSYCHIATRIC: No depression, no loss of interest in normal activity or change in sleep pattern.     Exam:   BP 140/86   Ht 5'  5" (1.651 m)   Wt 156 lb (70.8 kg)   BMI 25.96 kg/m   Body mass index is 25.96 kg/m.  General appearance : Well developed well nourished female. No acute distress HEENT: Eyes: no retinal hemorrhage or exudates,  Neck supple, trachea midline, no carotid bruits, no thyroidmegaly Lungs: Clear to auscultation, no rhonchi or wheezes, or rib retractions  Heart: Regular rate and rhythm, no murmurs or gallops Breast:Examined in sitting and supine position were symmetrical in appearance, no palpable masses or tenderness,  no skin retraction, no nipple inversion, no nipple discharge, no skin discoloration, no axillary or supraclavicular lymphadenopathy Abdomen: no palpable masses or tenderness, no rebound or guarding Extremities: no edema or skin discoloration or tenderness  Pelvic: Vulva normal  Bartholin, Urethra, Skene Glands: Within normal limits             Vagina: No gross lesions or discharge  Cervix: No gross lesions or discharge.  Pap reflex done.  Uterus  AV, normal size, shape and consistency, non-tender and mobile  Adnexa  Without masses or tenderness  Anus and perineum  normal    Assessment/Plan:  61 y.o. female for annual exam   1. Encounter for routine gynecological examination with Papanicolaou smear of cervix Normal gynecologic exam.  Pap test reflex done.  Breast exam normal.  Last screening mammogram March 2018 negative.  Fasting labs done today. - CBC - Comp Met (CMET) - TSH - Lipid Profile - Vitamin  D 1,25 dihydroxy  2. Menopause present Menopausal on no hormone replacement therapy.  Well-tolerated.  Vitamin D supplements.  Calcium in food.  Weightbearing physical activity.  Follow-up bone density.  Last bone density November 2016 osteopenia. - DG Bone Density; Future  3. Osteopenia after menopause Bone density November 2016 osteopenia.  Follow-up bone density - Vitamin D 1,25 dihydroxy - DG Bone Density; Future  4. Herpes simplex vulvovaginitis History of  recurrent genital herpes.  Re-prescription of acyclovir prophylaxis.   Princess Bruins MD, 10:50 AM 07/30/2017

## 2017-07-30 NOTE — Patient Instructions (Signed)
1. Encounter for routine gynecological examination with Papanicolaou smear of cervix Normal gynecologic exam.  Pap test reflex done.  Breast exam normal.  Last screening mammogram March 2018 negative.  Fasting labs done today. - CBC - Comp Met (CMET) - TSH - Lipid Profile - Vitamin D 1,25 dihydroxy  2. Menopause present Menopausal on no hormone replacement therapy.  Well-tolerated.  Vitamin D supplements.  Calcium in food.  Weightbearing physical activity.  Follow-up bone density.  Last bone density November 2016 osteopenia. - DG Bone Density; Future  3. Osteopenia after menopause Bone density November 2016 osteopenia.  Follow-up bone density - Vitamin D 1,25 dihydroxy - DG Bone Density; Future  4. Herpes simplex vulvovaginitis History of recurrent genital herpes.  Re-prescription of acyclovir prophylaxis.  Emily Brandt, it was a pleasure seeing you today.  I will inform you of your results as soon as available.   Health Maintenance for Postmenopausal Women Menopause is a normal process in which your reproductive ability comes to an end. This process happens gradually over a span of months to years, usually between the ages of 30 and 13. Menopause is complete when you have missed 12 consecutive menstrual periods. It is important to talk with your health care provider about some of the most common conditions that affect postmenopausal women, such as heart disease, cancer, and bone loss (osteoporosis). Adopting a healthy lifestyle and getting preventive care can help to promote your health and wellness. Those actions can also lower your chances of developing some of these common conditions. What should I know about menopause? During menopause, you may experience a number of symptoms, such as:  Moderate-to-severe hot flashes.  Night sweats.  Decrease in sex drive.  Mood swings.  Headaches.  Tiredness.  Irritability.  Memory problems.  Insomnia.  Choosing to treat or not to  treat menopausal changes is an individual decision that you make with your health care provider. What should I know about hormone replacement therapy and supplements? Hormone therapy products are effective for treating symptoms that are associated with menopause, such as hot flashes and night sweats. Hormone replacement carries certain risks, especially as you become older. If you are thinking about using estrogen or estrogen with progestin treatments, discuss the benefits and risks with your health care provider. What should I know about heart disease and stroke? Heart disease, heart attack, and stroke become more likely as you age. This may be due, in part, to the hormonal changes that your body experiences during menopause. These can affect how your body processes dietary fats, triglycerides, and cholesterol. Heart attack and stroke are both medical emergencies. There are many things that you can do to help prevent heart disease and stroke:  Have your blood pressure checked at least every 1-2 years. High blood pressure causes heart disease and increases the risk of stroke.  If you are 36-46 years old, ask your health care provider if you should take aspirin to prevent a heart attack or a stroke.  Do not use any tobacco products, including cigarettes, chewing tobacco, or electronic cigarettes. If you need help quitting, ask your health care provider.  It is important to eat a healthy diet and maintain a healthy weight. ? Be sure to include plenty of vegetables, fruits, low-fat dairy products, and lean protein. ? Avoid eating foods that are high in solid fats, added sugars, or salt (sodium).  Get regular exercise. This is one of the most important things that you can do for your health. ? Try to  exercise for at least 150 minutes each week. The type of exercise that you do should increase your heart rate and make you sweat. This is known as moderate-intensity exercise. ? Try to do strengthening  exercises at least twice each week. Do these in addition to the moderate-intensity exercise.  Know your numbers.Ask your health care provider to check your cholesterol and your blood glucose. Continue to have your blood tested as directed by your health care provider.  What should I know about cancer screening? There are several types of cancer. Take the following steps to reduce your risk and to catch any cancer development as early as possible. Breast Cancer  Practice breast self-awareness. ? This means understanding how your breasts normally appear and feel. ? It also means doing regular breast self-exams. Let your health care provider know about any changes, no matter how small.  If you are 69 or older, have a clinician do a breast exam (clinical breast exam or CBE) every year. Depending on your age, family history, and medical history, it may be recommended that you also have a yearly breast X-ray (mammogram).  If you have a family history of breast cancer, talk with your health care provider about genetic screening.  If you are at high risk for breast cancer, talk with your health care provider about having an MRI and a mammogram every year.  Breast cancer (BRCA) gene test is recommended for women who have family members with BRCA-related cancers. Results of the assessment will determine the need for genetic counseling and BRCA1 and for BRCA2 testing. BRCA-related cancers include these types: ? Breast. This occurs in males or females. ? Ovarian. ? Tubal. This may also be called fallopian tube cancer. ? Cancer of the abdominal or pelvic lining (peritoneal cancer). ? Prostate. ? Pancreatic.  Cervical, Uterine, and Ovarian Cancer Your health care provider may recommend that you be screened regularly for cancer of the pelvic organs. These include your ovaries, uterus, and vagina. This screening involves a pelvic exam, which includes checking for microscopic changes to the surface of  your cervix (Pap test).  For women ages 21-65, health care providers may recommend a pelvic exam and a Pap test every three years. For women ages 46-65, they may recommend the Pap test and pelvic exam, combined with testing for human papilloma virus (HPV), every five years. Some types of HPV increase your risk of cervical cancer. Testing for HPV may also be done on women of any age who have unclear Pap test results.  Other health care providers may not recommend any screening for nonpregnant women who are considered low risk for pelvic cancer and have no symptoms. Ask your health care provider if a screening pelvic exam is right for you.  If you have had past treatment for cervical cancer or a condition that could lead to cancer, you need Pap tests and screening for cancer for at least 20 years after your treatment. If Pap tests have been discontinued for you, your risk factors (such as having a new sexual partner) need to be reassessed to determine if you should start having screenings again. Some women have medical problems that increase the chance of getting cervical cancer. In these cases, your health care provider may recommend that you have screening and Pap tests more often.  If you have a family history of uterine cancer or ovarian cancer, talk with your health care provider about genetic screening.  If you have vaginal bleeding after reaching menopause, tell your  health care provider.  There are currently no reliable tests available to screen for ovarian cancer.  Lung Cancer Lung cancer screening is recommended for adults 51-70 years old who are at high risk for lung cancer because of a history of smoking. A yearly low-dose CT scan of the lungs is recommended if you:  Currently smoke.  Have a history of at least 30 pack-years of smoking and you currently smoke or have quit within the past 15 years. A pack-year is smoking an average of one pack of cigarettes per day for one year.  Yearly  screening should:  Continue until it has been 15 years since you quit.  Stop if you develop a health problem that would prevent you from having lung cancer treatment.  Colorectal Cancer  This type of cancer can be detected and can often be prevented.  Routine colorectal cancer screening usually begins at age 66 and continues through age 9.  If you have risk factors for colon cancer, your health care provider may recommend that you be screened at an earlier age.  If you have a family history of colorectal cancer, talk with your health care provider about genetic screening.  Your health care provider may also recommend using home test kits to check for hidden blood in your stool.  A small camera at the end of a tube can be used to examine your colon directly (sigmoidoscopy or colonoscopy). This is done to check for the earliest forms of colorectal cancer.  Direct examination of the colon should be repeated every 5-10 years until age 32. However, if early forms of precancerous polyps or small growths are found or if you have a family history or genetic risk for colorectal cancer, you may need to be screened more often.  Skin Cancer  Check your skin from head to toe regularly.  Monitor any moles. Be sure to tell your health care provider: ? About any new moles or changes in moles, especially if there is a change in a mole's shape or color. ? If you have a mole that is larger than the size of a pencil eraser.  If any of your family members has a history of skin cancer, especially at a young age, talk with your health care provider about genetic screening.  Always use sunscreen. Apply sunscreen liberally and repeatedly throughout the day.  Whenever you are outside, protect yourself by wearing long sleeves, pants, a wide-brimmed hat, and sunglasses.  What should I know about osteoporosis? Osteoporosis is a condition in which bone destruction happens more quickly than new bone creation.  After menopause, you may be at an increased risk for osteoporosis. To help prevent osteoporosis or the bone fractures that can happen because of osteoporosis, the following is recommended:  If you are 89-66 years old, get at least 1,000 mg of calcium and at least 600 mg of vitamin D per day.  If you are older than age 62 but younger than age 64, get at least 1,200 mg of calcium and at least 600 mg of vitamin D per day.  If you are older than age 50, get at least 1,200 mg of calcium and at least 800 mg of vitamin D per day.  Smoking and excessive alcohol intake increase the risk of osteoporosis. Eat foods that are rich in calcium and vitamin D, and do weight-bearing exercises several times each week as directed by your health care provider. What should I know about how menopause affects my mental health? Depression  may occur at any age, but it is more common as you become older. Common symptoms of depression include:  Low or sad mood.  Changes in sleep patterns.  Changes in appetite or eating patterns.  Feeling an overall lack of motivation or enjoyment of activities that you previously enjoyed.  Frequent crying spells.  Talk with your health care provider if you think that you are experiencing depression. What should I know about immunizations? It is important that you get and maintain your immunizations. These include:  Tetanus, diphtheria, and pertussis (Tdap) booster vaccine.  Influenza every year before the flu season begins.  Pneumonia vaccine.  Shingles vaccine.  Your health care provider may also recommend other immunizations. This information is not intended to replace advice given to you by your health care provider. Make sure you discuss any questions you have with your health care provider. Document Released: 11/10/2005 Document Revised: 04/07/2016 Document Reviewed: 06/22/2015 Elsevier Interactive Patient Education  2018 [employer].

## 2017-08-01 LAB — PAP IG W/ RFLX HPV ASCU

## 2017-08-02 DIAGNOSIS — M8580 Other specified disorders of bone density and structure, unspecified site: Secondary | ICD-10-CM

## 2017-08-02 HISTORY — DX: Other specified disorders of bone density and structure, unspecified site: M85.80

## 2017-08-02 LAB — VITAMIN D 1,25 DIHYDROXY
Vitamin D 1, 25 (OH)2 Total: 25 pg/mL (ref 18–72)
Vitamin D2 1, 25 (OH)2: <8 pg/mL
Vitamin D3 1, 25 (OH)2: 25 pg/mL

## 2017-08-02 LAB — COMPREHENSIVE METABOLIC PANEL
AG RATIO: 1.9 (calc) (ref 1.0–2.5)
ALBUMIN MSPROF: 4.5 g/dL (ref 3.6–5.1)
ALT: 31 U/L — ABNORMAL HIGH (ref 6–29)
AST: 31 U/L (ref 10–35)
Alkaline phosphatase (APISO): 60 U/L (ref 33–130)
BUN: 13 mg/dL (ref 7–25)
CHLORIDE: 102 mmol/L (ref 98–110)
CO2: 28 mmol/L (ref 20–32)
CREATININE: 0.81 mg/dL (ref 0.50–0.99)
Calcium: 9.3 mg/dL (ref 8.6–10.4)
GLOBULIN: 2.4 g/dL (ref 1.9–3.7)
GLUCOSE: 96 mg/dL (ref 65–99)
POTASSIUM: 4.1 mmol/L (ref 3.5–5.3)
SODIUM: 139 mmol/L (ref 135–146)
Total Bilirubin: 0.6 mg/dL (ref 0.2–1.2)
Total Protein: 6.9 g/dL (ref 6.1–8.1)

## 2017-08-02 LAB — CBC
HCT: 42.7 % (ref 35.0–45.0)
HEMOGLOBIN: 14.4 g/dL (ref 11.7–15.5)
MCH: 30.7 pg (ref 27.0–33.0)
MCHC: 33.7 g/dL (ref 32.0–36.0)
MCV: 91 fL (ref 80.0–100.0)
MPV: 11.6 fL (ref 7.5–12.5)
PLATELETS: 278 10*3/uL (ref 140–400)
RBC: 4.69 10*6/uL (ref 3.80–5.10)
RDW: 11.6 % (ref 11.0–15.0)
WBC: 6 10*3/uL (ref 3.8–10.8)

## 2017-08-02 LAB — LIPID PANEL
Cholesterol: 178 mg/dL (ref <200)
HDL: 66 mg/dL (ref >50)
LDL CHOLESTEROL (CALC): 90 mg/dL
NON-HDL CHOLESTEROL (CALC): 112 mg/dL (ref <130)
Total CHOL/HDL Ratio: 2.7 (calc) (ref <5.0)
Triglycerides: 128 mg/dL (ref <150)

## 2017-08-02 LAB — TSH: TSH: 1.2 mIU/L (ref 0.40–4.50)

## 2017-08-03 ENCOUNTER — Other Ambulatory Visit: Payer: Self-pay | Admitting: Obstetrics & Gynecology

## 2017-08-03 DIAGNOSIS — R740 Nonspecific elevation of levels of transaminase and lactic acid dehydrogenase [LDH]: Principal | ICD-10-CM

## 2017-08-03 DIAGNOSIS — R7401 Elevation of levels of liver transaminase levels: Secondary | ICD-10-CM

## 2017-08-13 ENCOUNTER — Ambulatory Visit (INDEPENDENT_AMBULATORY_CARE_PROVIDER_SITE_OTHER): Payer: 59

## 2017-08-13 DIAGNOSIS — M810 Age-related osteoporosis without current pathological fracture: Secondary | ICD-10-CM | POA: Diagnosis not present

## 2017-08-13 DIAGNOSIS — Z780 Asymptomatic menopausal state: Secondary | ICD-10-CM

## 2017-08-13 DIAGNOSIS — Z13820 Encounter for screening for osteoporosis: Secondary | ICD-10-CM

## 2017-08-14 ENCOUNTER — Encounter: Payer: Self-pay | Admitting: Gynecology

## 2017-08-27 ENCOUNTER — Other Ambulatory Visit: Payer: 59

## 2017-09-03 ENCOUNTER — Other Ambulatory Visit: Payer: 59

## 2017-09-03 DIAGNOSIS — R7401 Elevation of levels of liver transaminase levels: Secondary | ICD-10-CM

## 2017-09-03 DIAGNOSIS — R740 Nonspecific elevation of levels of transaminase and lactic acid dehydrogenase [LDH]: Principal | ICD-10-CM

## 2017-09-03 LAB — ALT: ALT: 23 U/L (ref 6–29)

## 2017-09-03 LAB — AST: AST: 25 U/L (ref 10–35)

## 2017-10-21 ENCOUNTER — Emergency Department (HOSPITAL_BASED_OUTPATIENT_CLINIC_OR_DEPARTMENT_OTHER): Payer: 59

## 2017-10-21 ENCOUNTER — Encounter (HOSPITAL_BASED_OUTPATIENT_CLINIC_OR_DEPARTMENT_OTHER): Payer: Self-pay | Admitting: Emergency Medicine

## 2017-10-21 ENCOUNTER — Other Ambulatory Visit: Payer: Self-pay

## 2017-10-21 ENCOUNTER — Emergency Department (HOSPITAL_BASED_OUTPATIENT_CLINIC_OR_DEPARTMENT_OTHER)
Admission: EM | Admit: 2017-10-21 | Discharge: 2017-10-21 | Disposition: A | Discharge status: Home / Self Care | Payer: 59 | Attending: Emergency Medicine | Admitting: Emergency Medicine

## 2017-10-21 DIAGNOSIS — Y929 Unspecified place or not applicable: Secondary | ICD-10-CM | POA: Insufficient documentation

## 2017-10-21 DIAGNOSIS — Z79899 Other long term (current) drug therapy: Secondary | ICD-10-CM | POA: Insufficient documentation

## 2017-10-21 DIAGNOSIS — S2232XA Fracture of one rib, left side, initial encounter for closed fracture: Secondary | ICD-10-CM | POA: Diagnosis not present

## 2017-10-21 DIAGNOSIS — R0789 Other chest pain: Secondary | ICD-10-CM | POA: Diagnosis not present

## 2017-10-21 DIAGNOSIS — W010XXA Fall on same level from slipping, tripping and stumbling without subsequent striking against object, initial encounter: Secondary | ICD-10-CM | POA: Insufficient documentation

## 2017-10-21 DIAGNOSIS — Y939 Activity, unspecified: Secondary | ICD-10-CM | POA: Diagnosis not present

## 2017-10-21 DIAGNOSIS — S2231XA Fracture of one rib, right side, initial encounter for closed fracture: Secondary | ICD-10-CM | POA: Insufficient documentation

## 2017-10-21 DIAGNOSIS — S299XXA Unspecified injury of thorax, initial encounter: Secondary | ICD-10-CM | POA: Diagnosis not present

## 2017-10-21 DIAGNOSIS — Y999 Unspecified external cause status: Secondary | ICD-10-CM | POA: Insufficient documentation

## 2017-10-21 DIAGNOSIS — S0181XA Laceration without foreign body of other part of head, initial encounter: Secondary | ICD-10-CM | POA: Insufficient documentation

## 2017-10-21 DIAGNOSIS — S0990XA Unspecified injury of head, initial encounter: Secondary | ICD-10-CM | POA: Diagnosis not present

## 2017-10-21 DIAGNOSIS — R55 Syncope and collapse: Secondary | ICD-10-CM | POA: Diagnosis not present

## 2017-10-21 LAB — BASIC METABOLIC PANEL
Anion gap: 8 (ref 5–15)
BUN: 14 mg/dL (ref 6–20)
CHLORIDE: 103 mmol/L (ref 101–111)
CO2: 26 mmol/L (ref 22–32)
Calcium: 9.4 mg/dL (ref 8.9–10.3)
Creatinine, Ser: 0.85 mg/dL (ref 0.44–1.00)
GFR calc Af Amer: >60 mL/min (ref >60)
Glucose, Bld: 122 mg/dL — ABNORMAL HIGH (ref 65–99)
Potassium: 3.8 mmol/L (ref 3.5–5.1)
SODIUM: 137 mmol/L (ref 135–145)

## 2017-10-21 LAB — CBC WITH DIFFERENTIAL/PLATELET
BASOS PCT: 0 %
Basophils Absolute: 0 10*3/uL (ref 0.0–0.1)
EOS ABS: 0.1 10*3/uL (ref 0.0–0.7)
Eosinophils Relative: 2 %
HCT: 41.5 % (ref 36.0–46.0)
HEMOGLOBIN: 13.9 g/dL (ref 12.0–15.0)
LYMPHS ABS: 0.9 10*3/uL (ref 0.7–4.0)
Lymphocytes Relative: 14 %
MCH: 31.1 pg (ref 26.0–34.0)
MCHC: 33.5 g/dL (ref 30.0–36.0)
MCV: 92.8 fL (ref 78.0–100.0)
Monocytes Absolute: 0.4 10*3/uL (ref 0.1–1.0)
Monocytes Relative: 7 %
NEUTROS PCT: 77 %
Neutro Abs: 4.7 10*3/uL (ref 1.7–7.7)
PLATELETS: 218 10*3/uL (ref 150–400)
RBC: 4.47 MIL/uL (ref 3.87–5.11)
RDW: 12.4 % (ref 11.5–15.5)
WBC: 6 10*3/uL (ref 4.0–10.5)

## 2017-10-21 LAB — URINALYSIS, ROUTINE W REFLEX MICROSCOPIC
BILIRUBIN URINE: NEGATIVE
GLUCOSE, UA: NEGATIVE mg/dL
HGB URINE DIPSTICK: NEGATIVE
KETONES UR: 15 mg/dL — AB
LEUKOCYTES UA: NEGATIVE
Nitrite: NEGATIVE
PH: 7.5 (ref 5.0–8.0)
PROTEIN: NEGATIVE mg/dL
Specific Gravity, Urine: 1.015 (ref 1.005–1.030)

## 2017-10-21 LAB — TROPONIN I

## 2017-10-21 MED ORDER — TETANUS-DIPHTH-ACELL PERTUSSIS 5-2.5-18.5 LF-MCG/0.5 IM SUSP
0.5000 mL | Freq: Once | INTRAMUSCULAR | Status: AC
Start: 2017-10-21 — End: 2017-10-21
  Filled 2017-10-21: qty 0.5

## 2017-10-21 MED ORDER — SODIUM CHLORIDE 0.9 % IV BOLUS (SEPSIS)
1000.0000 mL | Freq: Once | INTRAVENOUS | Status: AC
Start: 2017-10-21 — End: 2017-10-21
  Administered 2017-10-21: 1000 mL via INTRAVENOUS

## 2017-10-21 NOTE — ED Provider Notes (Signed)
[facility]PART[facility] Provider Note   CSN: [id number] Arrival date & time: 10/21/17  0603     History   Chief Complaint Chief Complaint  Patient presents with  . Loss of Consciousness    HPI Emily Brandt is a 62 y.o. female.  HPI  This is a 62 year old female with a history of mitral valve prolapse and reflux who presents with an episode of syncope.  Patient reports that she was lying in bed this morning.  She began to feel hot and nauseated.  She went to the restroom and she thinks she may have passed out.  On her way to the restroom she did begin to feel dizzy.  However, she did not feel a prodrome and found herself on the floor.  She hit her left eye.  She has pain in the left eye and left side of her chest.  She denies any weakness, numbness, focal deficits.  Recently reports URI symptoms for which she has been treated with over-the-counter medications including Sudafed.  Denies any alcohol or drug use.  Denies chest pain, shortness of breath, vomiting, diarrhea.  Currently she denies any dizziness or nausea.  Past Medical History:  Diagnosis Date  . Abnormal finding on Pap smear    Cervicitis  . Arthritis   . Elevated LFTs   . Endometrial polyp   . Gallstones   . GERD (gastroesophageal reflux disease)   . Herpes progenitalis   . MVP (mitral valve prolapse)    antibiotic for dental procedures  . Osteopenia 08/2017   T score -1.4 FRAX 7.7% / 0.7%    Patient Active Problem List   Diagnosis Date Noted  . Primary osteoarthritis of both hands 05/15/2017  . Primary osteoarthritis of both hips 05/15/2017  . Primary osteoarthritis of both knees 05/15/2017  . DDD (degenerative disc disease), cervical s/p cervical fusion 05/15/2017  . DDD (degenerative disc disease), lumbar 05/15/2017  . ANA positive has had no clinical features of autoimmune disease  05/15/2017  . Osteopenia of multiple sites 05/15/2017  . History of goiter 05/15/2017  .  Multinodular goiter 06/01/2016  . 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 Preseptal cellulitis 10/24/2015  . Abnormal finding on Pap smear   . Herpes progenitalis   . MVP (mitral valve prolapse)     Past Surgical History:  Procedure Laterality Date  . BIOPSY THYROID  07/2016  . CERVICAL FUSION    . COLPOSCOPY    . DILATION AND CURETTAGE OF UTERUS    . HYSTEROSCOPY    . LAPAROSCOPIC CHOLECYSTECTOMY  09/03/2012  . NASAL SEPTUM SURGERY    . NASAL SEPTUM SURGERY  1974  . SHOULDER SURGERY    . TONSILLECTOMY    . TONSILLECTOMY  1965  . UTERINE FIBROID SURGERY  2005    OB History    Gravida Para Term Preterm AB Living   1       1 0   SAB TAB Ectopic Multiple Live Births   1               Home Medications    Prior to Admission medications   Medication Sig Start Date End Date Taking? Authorizing Provider  acyclovir (ZOVIRAX) 400 MG tablet Take 1 tablet (400 mg total) by mouth daily. 07/30/17   Princess Bruins, MD  BIOTIN PO Take 1 tablet by mouth daily. Reported on 11/30/2015    [provider]  CALCIUM PO Take 1 tablet by mouth daily.  [provider]  Coenzyme Q10 (CO Q 10 PO) Take 1 tablet by mouth daily.    [provider]  Multiple Vitamin (MULTIVITAMIN) tablet Take 1 tablet by mouth daily.      [provider]  nortriptyline (PAMELOR) 50 MG capsule TAKE 1 CAPSULE (50 MG TOTAL) BY MOUTH AT BEDTIME 09/15/15   [provider]  TURMERIC PO Take 1 capsule by mouth daily. Reported on 11/30/2015    [provider]    Family History Family History  Adopted: Yes  Problem Relation Age of Onset  . Colon cancer Mother   . Hypertension Mother   . Osteoporosis Mother   . Cancer Mother   . Kidney failure Father   . Heart disease Sister   . Osteoporosis Sister   . Diabetes Maternal Grandmother   . Heart disease Maternal Grandfather   . Esophageal cancer Neg Hx   . Rectal cancer Neg Hx   . Stomach cancer Neg Hx   .  Breast cancer Neg Hx     Social History Social History   Tobacco Use  . Smoking status: Never Smoker  . Smokeless tobacco: Never Used  Substance Use Topics  . Alcohol use: Yes    Alcohol/week: 0.0 oz    Types: 1 - 2 Glasses of wine per week    Comment: occas  . Drug use: No     Allergies   Sulfa antibiotics   Review of Systems Review of Systems  Constitutional: Negative for fever.  HENT: Positive for congestion and facial swelling.   Respiratory: Negative for cough and shortness of breath.   Cardiovascular: Negative for chest pain.  Gastrointestinal: Positive for nausea. Negative for abdominal pain and vomiting.  Musculoskeletal: Negative for back pain and neck pain.  Skin: Positive for wound.  Neurological: Positive for dizziness and syncope. Negative for headaches.  All other systems reviewed and are negative.    Physical Exam Updated Vital Signs BP 124/63 (BP Location: Left Arm)   Pulse 91   Temp 97.7 F (36.5 C) (Oral)   Ht 5\' 5"  (1.651 m)   Wt 72.1 kg (159 lb)   SpO2 100%   BMI 26.46 kg/m   Physical Exam  Constitutional: She is oriented to person, place, and time. She appears well-developed and well-nourished. No distress.  HENT:  Head: Normocephalic.  Less than 1 cm vertical laceration just lateral to the left eye, associated ecchymosis and mild swelling with tenderness to palpation, normal bite, midface stable  Eyes: Pupils are equal, round, and reactive to light.  Extraocular movements intact, pupils 5 mm reactive bilaterally  Neck: Normal range of motion. Neck supple.  No midline C-spine tenderness  Cardiovascular: Normal rate, regular rhythm and normal heart sounds.  Pulmonary/Chest: Effort normal and breath sounds normal. No respiratory distress. She has no wheezes.  Abdominal: Soft. Bowel sounds are normal. There is no tenderness. There is no guarding.  Musculoskeletal: Normal range of motion. She exhibits no edema or deformity.  Neurological:  She is alert and oriented to person, place, and time.  5 out of 5 strength in all 4 extremities, no dysmetria to finger-nose-finger, cranial nerves II through XII intact  Skin: Skin is warm and dry.  Psychiatric: She has a normal mood and affect.  Nursing note and vitals reviewed.    ED Treatments / Results  Labs (all labs ordered are listed, but only abnormal results are displayed) Labs Reviewed  BASIC METABOLIC PANEL - Abnormal; Notable for the following components:  Result Value   Glucose, Bld 122 (*)    All other components within normal limits  CBC WITH DIFFERENTIAL/PLATELET  TROPONIN I  URINALYSIS, ROUTINE W REFLEX MICROSCOPIC    EKG  EKG Interpretation  Date/Time:  Sunday October 21 2017 06:17:07 EST Ventricular Rate:  79 PR Interval:    QRS Duration: 127 QT Interval:  405 QTC Calculation: 465 R Axis:   57 Text Interpretation:  Sinus rhythm Right bundle branch block No significant change since last tracing Confirmed by Thayer Jew [phone]) on 10/21/2017 6:30:52 AM       Radiology Dg Chest 2 View  Result Date: 10/21/2017 CLINICAL DATA:  62 year old female with fall.  Left chest wall pain. EXAM: CHEST  2 VIEW COMPARISON:  Chest radiograph dated 11/04/2015 FINDINGS: The lungs are clear. There is no pleural effusion or pneumothorax. The cardiac silhouette is within normal limits lower cervical ACDF. Apparent linear lucency through the anterior left seventh rib likely artifactual. Clinical correlation is recommended. If there is high clinical concern for an acute rib fracture. Further evaluation with rib series recommended. IMPRESSION: 1. No acute cardiopulmonary process. 2. Artifact versus less likely nondisplaced fracture of the anterior left seventh rib clinical correlation is recommended. Electronically Signed   By: Anner Crete M.D.   On: 10/21/2017 07:12   Ct Head Wo Contrast  Result Date: 10/21/2017 CLINICAL DATA:  62 year old female with hot sensation  and nausea. Syncope. Small laceration lateral to the left side of the eye. EXAM: CT HEAD WITHOUT CONTRAST TECHNIQUE: Contiguous axial images were obtained from the base of the skull through the vertex without intravenous contrast. COMPARISON:  Head CT 10/23/2015. FINDINGS: Brain: No evidence of acute infarction, hemorrhage, hydrocephalus, extra-axial collection or mass lesion/mass effect. Vascular: No hyperdense vessel or unexpected calcification. Skull: Normal. Negative for fracture or focal lesion. Sinuses/Orbits: No acute finding. Other: None. IMPRESSION: 1. No acute intracranial abnormalities. 2. The appearance of the brain is normal. Electronically Signed   By: Vinnie Langton M.D.   On: 10/21/2017 07:15    Procedures Procedures (including critical care time)  Medications Ordered in ED Medications  sodium chloride 0.9 % bolus 1,000 mL (1,000 mLs Intravenous New Bag/Given 10/21/17 0721)     Initial Impression / Assessment and Plan / ED Course  I have reviewed the triage vital signs and the nursing notes.  Pertinent labs & imaging results that were available during my care of the patient were reviewed by me and considered in my medical decision making (see chart for details).     Patient presents with syncope and dizziness.  Presents with an episode of syncope.  Suspect vasovagal given history.  She is nonfocal on exam and vital signs are reassuring.  She has orthostatic with a small drop in her blood pressure.  Patient was given fluids.  Full workup is pending.  Patient signed out to Dr. Regenia Skeeter.  Final Clinical Impressions(s) / ED Diagnoses   Final diagnoses:  Syncope, unspecified syncope type  Minor head injury, initial encounter    ED Discharge Orders    None       Horton, Barbette Hair, MD 10/21/17 [phone]

## 2017-10-21 NOTE — ED Triage Notes (Signed)
Pt states she was laying in bed, feeling hot and nauseated. Attempted to go to bathroom to get a trash can. Pt thinks she passed out coming back from the bathroom. Woke up on bathroom floor disoriented. Pt has small lac to lateral side of L eye that is not bleeding presently. C/o dizziness when getting up, and numbness to L side of face. Pt denies dizziness now, and states numbness is still there but lessened since onset.  Reports pain on L ribcage.

## 2017-10-21 NOTE — ED Provider Notes (Signed)
Care transferred to me. CT benign, labs unremarkable. CXR with likely nondisplaced fracture, especially given localized to pain to left chest.  Last tetanus was in 2015.  Glue applied to the small lateral laceration next to her eye.  She feels better and has ambulated to the bathroom without difficulty.  She was given IV fluids.  No clear cause for her syncope but I think arrhythmia or other acute emergent condition is unlikely.  Follow-up with PCP.  Return precautions.  She declines medicine stronger than Tylenol/ibuprofen for the ribs.  LACERATION REPAIR Performed by: Ephraim Hamburger Authorized by: Ephraim Hamburger Consent: Verbal consent obtained. Risks and benefits: risks, benefits and alternatives were discussed Consent given by: patient Patient identity confirmed: provided demographic data Prepped and Draped in normal sterile fashion Wound explored  Laceration Location: left periorbital  Laceration Length: 1cm  No Foreign Bodies seen or palpated  Anesthesia: None  Local anesthetic: None  Amount of cleaning: standard  Skin closure: Dermabond  Number of sutures: N/A  Technique: Glue  Patient tolerance: Patient tolerated the procedure well with no immediate complications.    Sherwood Gambler, MD 10/21/17 [phone]

## 2017-10-31 DIAGNOSIS — R0781 Pleurodynia: Secondary | ICD-10-CM | POA: Diagnosis not present

## 2017-10-31 DIAGNOSIS — S0083XD Contusion of other part of head, subsequent encounter: Secondary | ICD-10-CM | POA: Diagnosis not present

## 2017-10-31 DIAGNOSIS — R55 Syncope and collapse: Secondary | ICD-10-CM | POA: Diagnosis not present

## 2017-11-06 ENCOUNTER — Other Ambulatory Visit: Payer: Self-pay | Admitting: Endocrinology

## 2017-11-06 DIAGNOSIS — E042 Nontoxic multinodular goiter: Secondary | ICD-10-CM

## 2017-11-13 ENCOUNTER — Ambulatory Visit
Admission: RE | Admit: 2017-11-13 | Discharge: 2017-11-13 | Disposition: A | Discharge status: Home / Self Care | Payer: 59 | Source: Ambulatory Visit | Attending: Endocrinology | Admitting: Endocrinology

## 2017-11-13 DIAGNOSIS — E042 Nontoxic multinodular goiter: Secondary | ICD-10-CM

## 2017-11-29 ENCOUNTER — Ambulatory Visit: Payer: 59 | Admitting: Endocrinology

## 2017-11-29 ENCOUNTER — Encounter: Payer: Self-pay | Admitting: Endocrinology

## 2017-11-29 VITALS — BP 132/84 | HR 86 | Ht 65.0 in | Wt 159.6 lb

## 2017-11-29 DIAGNOSIS — E042 Nontoxic multinodular goiter: Secondary | ICD-10-CM | POA: Diagnosis not present

## 2017-11-29 NOTE — Progress Notes (Signed)
Patient ID: Emily Brandt, female   DOB: [birth year], 62 y.o.   MRN: [id number]           Reason for Appointment: Follow-up of multiple nodules    History of Present Illness:   The patient's thyroid enlargement was first discovered in 1/17 incidentally when she was having a CT scan  She has had an ultrasound exam in 11/2015 which showed the following  LEFT lobe: Hypoechoic solid left midpole nodule measures 21 x 16 x 14 mm.  Echogenic partially cystic nodule in the lower pole measures 22 x 16 x 17 mm.  COMPARISON from 8/17 ultrasound: Findings of 2.2 cm mid right, 2.1 cm upper left, and 2.3 cm lower left nodules   The right lobe showed: Solid midpole nodule with coarse calcification measures 13 mm in greatest dimension. Mid to lower pole large dystrophic calcification measures 15 x 10 x12 mm with posterior shadowing. Hypoechoic partially cystic nodule in the right lower pole measures10 x 8 x 9 mm. COMPARISON from 8/17: Poorly marginated 2.2 x 1.3 x 1.6 cm nodule with heavy coarse calcifications, mid lobe (previously 1.4 x 1 x 1.1 cm).  Since there was a change in the size of the right-sided nodule patient was wanting to have this biopsied and NEEDLE ASPIRATION result was benign  RECENT history:  On her last visit in 11/2016 her right thyroid lobe on exam was smaller  She has had no difficulty with swallowing   Does not feel like she has any choking sensation in her neck or pressure  Follow-up ultrasound in 11/2017 shows stability of all the nodules and she has a total of 8 nodules. Previously biopsied nodules are not increasing in size  The previously biopsied approximately 2.6 cm nodule within the mid/inferior pole the left lobe of the thyroid (labeled #7) is unchanged compared to the 05/2016 examination, previously, 2.3 cm with slight differences likely total to interval partial cystic degeneration.  Although the radiologist as recommended follow-up of 2 of the nodules these  are both about 1.1 cm in size and either stable or smaller than before  Her thyroid functions have been normal  Lab Results  Component Value Date   TSH 1.20 07/30/2017   TSH 1.05 05/23/2016   TSH 1.322 10/24/2015   FREET4 1.21 05/23/2016     Allergies as of 11/29/2017      Reactions   Sulfa Antibiotics Swelling      Medication List        Accurate as of 11/29/17  9:50 AM. Always use your most recent med list.          acyclovir 400 MG tablet Commonly known as:  ZOVIRAX Take 1 tablet (400 mg total) by mouth daily.   BIOTIN PO Take 1 tablet by mouth daily. Reported on 11/30/2015   CALCIUM PO Take 4 tablets by mouth daily.   CO Q 10 PO Take 1 tablet by mouth daily.   multivitamin tablet Take 1 tablet by mouth daily.   nortriptyline 50 MG capsule Commonly known as:  PAMELOR TAKE 1 CAPSULE (50 MG TOTAL) BY MOUTH AT BEDTIME   TURMERIC PO Take 1 capsule by mouth daily. Reported on 11/30/2015       Allergies:  Allergies  Allergen Reactions  . Sulfa Antibiotics Swelling    Past Medical History:  Diagnosis Date  . Abnormal finding on Pap smear    Cervicitis  . Arthritis   . Elevated LFTs   . Endometrial polyp   .  Gallstones   . GERD (gastroesophageal reflux disease)   . Herpes progenitalis   . MVP (mitral valve prolapse)    antibiotic for dental procedures  . Osteopenia 08/2017   T score -1.4 FRAX 7.7% / 0.7%    Past Surgical History:  Procedure Laterality Date  . BIOPSY THYROID  07/2016  . CERVICAL FUSION    . COLPOSCOPY    . DILATION AND CURETTAGE OF UTERUS    . HYSTEROSCOPY    . LAPAROSCOPIC CHOLECYSTECTOMY  09/03/2012  . NASAL SEPTUM SURGERY    . NASAL SEPTUM SURGERY  1974  . SHOULDER SURGERY    . TONSILLECTOMY    . TONSILLECTOMY  1965  . UTERINE FIBROID SURGERY  2005    Family History  Adopted: Yes  Problem Relation Age of Onset  . Colon cancer Mother   . Hypertension Mother   . Osteoporosis Mother   . Cancer Mother   . Kidney  failure Father   . Heart disease Sister   . Osteoporosis Sister   . Diabetes Maternal Grandmother   . Heart disease Maternal Grandfather   . Esophageal cancer Neg Hx   . Rectal cancer Neg Hx   . Stomach cancer Neg Hx   . Breast cancer Neg Hx     Social History:  reports that  has never smoked. she has never used smokeless tobacco. She reports that she drinks alcohol. She reports that she does not use drugs.    Review of Systems    Examination:   BP 132/84   Pulse 86   Ht 5\' 5"  (1.651 m)   Wt 159 lb 9.6 oz (72.4 kg)   SpO2 98%   BMI 26.56 kg/m  ,          THYROID:   The thyroid overall is firm and nodular  It feels about the same size both sides and isthmus and about twice normal size No cervical lymphadenopathy  Assessment/Plan:  Multinodular goiter with multiple nodules on each side, discovered incidentally on CT scan done for other reasons.  Her thyroid nodules are stable in size on follow-up ultrasound Discussed results of the ultrasound Also explained that the relatively small nodules that the radiologist recommends follow-up are stable in size or smaller and does not need to be monitored  On clinical exam her goiter is relatively small compared to when she was first seen She has no local compression symptoms also Thyroid functions have been consistently normal  Recommended that she just have a clinical follow-up in one year and no further ultrasounds unless there is any change on exam or symptoms    Elayne Snare 11/29/2017

## 2018-01-07 ENCOUNTER — Other Ambulatory Visit: Payer: Self-pay | Admitting: Obstetrics & Gynecology

## 2018-01-07 DIAGNOSIS — Z1231 Encounter for screening mammogram for malignant neoplasm of breast: Secondary | ICD-10-CM

## 2018-01-23 DIAGNOSIS — M4802 Spinal stenosis, cervical region: Secondary | ICD-10-CM | POA: Diagnosis not present

## 2018-01-23 DIAGNOSIS — M5136 Other intervertebral disc degeneration, lumbar region: Secondary | ICD-10-CM | POA: Diagnosis not present

## 2018-01-25 ENCOUNTER — Ambulatory Visit
Admission: RE | Admit: 2018-01-25 | Discharge: 2018-01-25 | Disposition: A | Discharge status: Home / Self Care | Payer: 59 | Source: Ambulatory Visit | Attending: Obstetrics & Gynecology | Admitting: Obstetrics & Gynecology

## 2018-01-25 DIAGNOSIS — Z1231 Encounter for screening mammogram for malignant neoplasm of breast: Secondary | ICD-10-CM

## 2018-03-18 ENCOUNTER — Ambulatory Visit: Payer: 59 | Admitting: Clinical

## 2018-03-18 DIAGNOSIS — F419 Anxiety disorder, unspecified: Secondary | ICD-10-CM | POA: Diagnosis not present

## 2018-04-29 ENCOUNTER — Ambulatory Visit: Payer: 59 | Admitting: Clinical

## 2018-04-29 DIAGNOSIS — F419 Anxiety disorder, unspecified: Secondary | ICD-10-CM | POA: Diagnosis not present

## 2018-05-24 DIAGNOSIS — G4700 Insomnia, unspecified: Secondary | ICD-10-CM | POA: Diagnosis not present

## 2018-06-10 ENCOUNTER — Ambulatory Visit: Payer: 59 | Admitting: Clinical

## 2018-06-10 DIAGNOSIS — F419 Anxiety disorder, unspecified: Secondary | ICD-10-CM | POA: Diagnosis not present

## 2018-06-15 DIAGNOSIS — Z23 Encounter for immunization: Secondary | ICD-10-CM | POA: Diagnosis not present

## 2018-06-24 ENCOUNTER — Ambulatory Visit: Payer: 59 | Admitting: Clinical

## 2018-06-24 DIAGNOSIS — F5101 Primary insomnia: Secondary | ICD-10-CM | POA: Diagnosis not present

## 2018-06-24 DIAGNOSIS — F419 Anxiety disorder, unspecified: Secondary | ICD-10-CM | POA: Diagnosis not present

## 2018-07-09 ENCOUNTER — Ambulatory Visit: Payer: 59 | Admitting: Clinical

## 2018-07-09 DIAGNOSIS — F419 Anxiety disorder, unspecified: Secondary | ICD-10-CM

## 2018-07-12 DIAGNOSIS — H1033 Unspecified acute conjunctivitis, bilateral: Secondary | ICD-10-CM | POA: Diagnosis not present

## 2018-07-22 ENCOUNTER — Ambulatory Visit: Payer: 59 | Admitting: Clinical

## 2018-07-22 DIAGNOSIS — F5101 Primary insomnia: Secondary | ICD-10-CM | POA: Diagnosis not present

## 2018-07-31 ENCOUNTER — Ambulatory Visit: Payer: 59 | Admitting: Obstetrics & Gynecology

## 2018-07-31 ENCOUNTER — Encounter: Payer: Self-pay | Admitting: Obstetrics & Gynecology

## 2018-07-31 VITALS — BP 126/78 | Ht 65.5 in | Wt 156.0 lb

## 2018-07-31 DIAGNOSIS — Z01419 Encounter for gynecological examination (general) (routine) without abnormal findings: Secondary | ICD-10-CM

## 2018-07-31 DIAGNOSIS — Z780 Asymptomatic menopausal state: Secondary | ICD-10-CM

## 2018-07-31 DIAGNOSIS — M85852 Other specified disorders of bone density and structure, left thigh: Secondary | ICD-10-CM

## 2018-07-31 DIAGNOSIS — Z8619 Personal history of other infectious and parasitic diseases: Secondary | ICD-10-CM | POA: Diagnosis not present

## 2018-07-31 MED ORDER — ACYCLOVIR 400 MG PO TABS: 400.0000 mg | ORAL_TABLET | Freq: Every day | ORAL | 4 refills | Status: DC

## 2018-07-31 NOTE — Progress Notes (Signed)
Emily Brandt 29-Feb-1956 [id number]   History:    62 y.o. G1P0A1  RP:  Established patient presenting for annual gyn exam   HPI: Menopause, well on no hormone replacement therapy.  No postmenopausal bleeding.  No pelvic pain.  No recurrence of genital herpes on acyclovir prophylaxis.  Breasts normal.  Urine and bowel movements normal.  Body mass index 25.56.  Physically active.  Health labs here today.  Colonoscopy in 2014.  Past medical history,surgical history, family history and social history were all reviewed and documented in the EPIC chart.  Gynecologic History No LMP recorded. Patient is postmenopausal. Contraception: post menopausal status Last Pap: 07/2017. Results were: Negative Last mammogram: 12/2017. Results were: Negative Bone Density: 08/2017 Osteopenia -1.4 Colonoscopy: 2014  Obstetric History OB History  Gravida Para Term Preterm AB Living  1       1 0  SAB TAB Ectopic Multiple Live Births  1            # Outcome Date GA Lbr Len/2nd Weight Sex Delivery Anes PTL Lv  1 SAB              ROS: A ROS was performed and pertinent positives and negatives are included in the history.  GENERAL: No fevers or chills. HEENT: No change in vision, no earache, sore throat or sinus congestion. NECK: No pain or stiffness. CARDIOVASCULAR: No chest pain or pressure. No palpitations. PULMONARY: No shortness of breath, cough or wheeze. GASTROINTESTINAL: No abdominal pain, nausea, vomiting or diarrhea, melena or bright red blood per rectum. GENITOURINARY: No urinary frequency, urgency, hesitancy or dysuria. MUSCULOSKELETAL: No joint or muscle pain, no back pain, no recent trauma. DERMATOLOGIC: No rash, no itching, no lesions. ENDOCRINE: No polyuria, polydipsia, no heat or cold intolerance. No recent change in weight. HEMATOLOGICAL: No anemia or easy bruising or bleeding. NEUROLOGIC: No headache, seizures, numbness, tingling or weakness. PSYCHIATRIC: No depression, no loss of interest  in normal activity or change in sleep pattern.     Exam:   BP 126/78   Ht 5' 5.5" (1.664 m)   Wt 156 lb (70.8 kg)   BMI 25.56 kg/m   Body mass index is 25.56 kg/m.  General appearance : Well developed well nourished female. No acute distress HEENT: Eyes: no retinal hemorrhage or exudates,  Neck supple, trachea midline, no carotid bruits, no thyroidmegaly Lungs: Clear to auscultation, no rhonchi or wheezes, or rib retractions  Heart: Regular rate and rhythm, no murmurs or gallops Breast:Examined in sitting and supine position were symmetrical in appearance, no palpable masses or tenderness,  no skin retraction, no nipple inversion, no nipple discharge, no skin discoloration, no axillary or supraclavicular lymphadenopathy Abdomen: no palpable masses or tenderness, no rebound or guarding Extremities: no edema or skin discoloration or tenderness  Pelvic: Vulva: Normal             Vagina: No gross lesions or discharge  Cervix: No gross lesions or discharge  Uterus  AV, normal size, shape and consistency, non-tender and mobile  Adnexa  Without masses or tenderness  Anus: Normal   Assessment/Plan:  62 y.o. female for annual exam   1. Well female exam with routine gynecological exam Normal gynecologic exam.  Pap -October 2018.  No indication to repeat this year.  Breast exam normal.  Screening mammogram April 2019 was negative.  Health labs here today.  Colonoscopy in 2014. - Lipid panel - TSH - Comp Met (CMET) - VITAMIN D 25 Hydroxy (Vit-D Deficiency,  Fractures) - CBC  2. Postmenopausal Well on no hormone replacement therapy.  No postmenopausal bleeding.  3. Osteopenia of neck of left femur Osteopenia on last bone density November 2018.  Vitamin D supplements, calcium intake of 1.5 g a day and regular weightbearing physical activity recommended.  Will repeat a bone density in November 2020.  4. History of herpes genitalis No recurrence of genital herpes on prophylaxis with  acyclovir.  Will continue on Acyclovir prophylaxis, Acyclovir represcribed.  Other orders - cholecalciferol (VITAMIN D) 1000 units tablet; Take 1,000 Units by mouth daily. - acyclovir (ZOVIRAX) 400 MG tablet; Take 1 tablet (400 mg total) by mouth daily.  Princess Bruins MD, 11:02 AM 07/31/2018

## 2018-08-01 ENCOUNTER — Encounter: Payer: Self-pay | Admitting: *Deleted

## 2018-08-01 LAB — LIPID PANEL
Cholesterol: 192 mg/dL (ref <200)
HDL: 62 mg/dL (ref >50)
LDL CHOLESTEROL (CALC): 105 mg/dL — AB
Non-HDL Cholesterol (Calc): 130 mg/dL (calc) — ABNORMAL HIGH (ref <130)
TRIGLYCERIDES: 142 mg/dL (ref <150)
Total CHOL/HDL Ratio: 3.1 (calc) (ref <5.0)

## 2018-08-01 LAB — CBC
HCT: 43.7 % (ref 35.0–45.0)
Hemoglobin: 14.8 g/dL (ref 11.7–15.5)
MCH: 30.8 pg (ref 27.0–33.0)
MCHC: 33.9 g/dL (ref 32.0–36.0)
MCV: 90.9 fL (ref 80.0–100.0)
MPV: 11.7 fL (ref 7.5–12.5)
PLATELETS: 275 10*3/uL (ref 140–400)
RBC: 4.81 10*6/uL (ref 3.80–5.10)
RDW: 11.9 % (ref 11.0–15.0)
WBC: 4.9 10*3/uL (ref 3.8–10.8)

## 2018-08-01 LAB — COMPREHENSIVE METABOLIC PANEL
AG RATIO: 2.1 (calc) (ref 1.0–2.5)
ALT: 32 U/L — ABNORMAL HIGH (ref 6–29)
AST: 28 U/L (ref 10–35)
Albumin: 4.7 g/dL (ref 3.6–5.1)
Alkaline phosphatase (APISO): 65 U/L (ref 33–130)
BILIRUBIN TOTAL: 0.7 mg/dL (ref 0.2–1.2)
BUN: 13 mg/dL (ref 7–25)
CHLORIDE: 102 mmol/L (ref 98–110)
CO2: 29 mmol/L (ref 20–32)
Calcium: 9.6 mg/dL (ref 8.6–10.4)
Creat: 0.81 mg/dL (ref 0.50–0.99)
GLOBULIN: 2.2 g/dL (ref 1.9–3.7)
Glucose, Bld: 90 mg/dL (ref 65–99)
Potassium: 4.4 mmol/L (ref 3.5–5.3)
Sodium: 139 mmol/L (ref 135–146)
Total Protein: 6.9 g/dL (ref 6.1–8.1)

## 2018-08-01 LAB — TSH: TSH: 1.35 mIU/L (ref 0.40–4.50)

## 2018-08-01 LAB — VITAMIN D 25 HYDROXY (VIT D DEFICIENCY, FRACTURES): Vit D, 25-Hydroxy: 45 ng/mL (ref 30–100)

## 2018-08-02 ENCOUNTER — Encounter: Payer: Self-pay | Admitting: Obstetrics & Gynecology

## 2018-08-02 NOTE — Patient Instructions (Signed)
1. Well female exam with routine gynecological exam Normal gynecologic exam.  Pap -October 2018.  No indication to repeat this year.  Breast exam normal.  Screening mammogram April 2019 was negative.  Health labs here today.  Colonoscopy in 2014. - Lipid panel - TSH - Comp Met (CMET) - VITAMIN D 25 Hydroxy (Vit-D Deficiency, Fractures) - CBC  2. Postmenopausal Well on no hormone replacement therapy.  No postmenopausal bleeding.  3. Osteopenia of neck of left femur Osteopenia on last bone density November 2018.  Vitamin D supplements, calcium intake of 1.5 g a day and regular weightbearing physical activity recommended.  Will repeat a bone density in November 2020.  4. History of herpes genitalis No recurrence of genital herpes on prophylaxis with acyclovir.  Will continue on Acyclovir prophylaxis, Acyclovir represcribed.  Other orders - cholecalciferol (VITAMIN D) 1000 units tablet; Take 1,000 Units by mouth daily. - acyclovir (ZOVIRAX) 400 MG tablet; Take 1 tablet (400 mg total) by mouth daily.  Emily Brandt, it was a pleasure seeing you today!  I will inform you of your results as soon as they are available.

## 2018-08-05 ENCOUNTER — Other Ambulatory Visit: Payer: Self-pay | Admitting: Obstetrics & Gynecology

## 2018-08-05 DIAGNOSIS — R945 Abnormal results of liver function studies: Secondary | ICD-10-CM

## 2018-08-05 DIAGNOSIS — R7401 Elevation of levels of liver transaminase levels: Secondary | ICD-10-CM

## 2018-08-05 DIAGNOSIS — R7989 Other specified abnormal findings of blood chemistry: Secondary | ICD-10-CM

## 2018-08-05 DIAGNOSIS — R740 Nonspecific elevation of levels of transaminase and lactic acid dehydrogenase [LDH]: Principal | ICD-10-CM

## 2018-08-22 ENCOUNTER — Ambulatory Visit: Payer: 59 | Admitting: Clinical

## 2018-08-22 DIAGNOSIS — K219 Gastro-esophageal reflux disease without esophagitis: Secondary | ICD-10-CM | POA: Diagnosis not present

## 2018-08-22 DIAGNOSIS — Z800 Family history of malignant neoplasm of digestive organs: Secondary | ICD-10-CM | POA: Diagnosis not present

## 2018-08-22 DIAGNOSIS — Z1211 Encounter for screening for malignant neoplasm of colon: Secondary | ICD-10-CM | POA: Diagnosis not present

## 2018-08-26 ENCOUNTER — Ambulatory Visit: Payer: 59 | Admitting: Clinical

## 2018-08-26 DIAGNOSIS — F419 Anxiety disorder, unspecified: Secondary | ICD-10-CM | POA: Diagnosis not present

## 2018-09-23 DIAGNOSIS — J0100 Acute maxillary sinusitis, unspecified: Secondary | ICD-10-CM | POA: Diagnosis not present

## 2018-10-06 ENCOUNTER — Encounter: Payer: Self-pay | Admitting: Gastroenterology

## 2018-10-22 ENCOUNTER — Ambulatory Visit: Payer: 59 | Admitting: Clinical

## 2018-10-22 DIAGNOSIS — F419 Anxiety disorder, unspecified: Secondary | ICD-10-CM | POA: Diagnosis not present

## 2018-11-01 ENCOUNTER — Encounter: Payer: Self-pay | Admitting: Endocrinology

## 2018-11-28 NOTE — Progress Notes (Signed)
Patient ID: Emily Brandt, female   DOB: 05-05-1956, 63 y.o.   MRN: [id number]           Reason for Appointment: Follow-up of multiple nodules    History of Present Illness:   The patient's thyroid enlargement was first discovered in 1/17 incidentally when she was having a CT scan  She has had an ultrasound exam in 11/2015 which showed the following  LEFT lobe: Hypoechoic solid left midpole nodule measures 21 x 16 x 14 mm.  Echogenic partially cystic nodule in the lower pole measures 22 x 16 x 17 mm.  COMPARISON from 8/17 ultrasound: Findings of 2.2 cm mid right, 2.1 cm upper left, and 2.3 cm lower left nodules   The right lobe showed: Solid midpole nodule with coarse calcification measures 13 mm in greatest dimension. Mid to lower pole large dystrophic calcification measures 15 x 10 x12 mm with posterior shadowing. Hypoechoic partially cystic nodule in the right lower pole measures10 x 8 x 9 mm. COMPARISON from 8/17: Poorly marginated 2.2 x 1.3 x 1.6 cm nodule with heavy coarse calcifications, mid lobe (previously 1.4 x 1 x 1.1 cm).  Since there was a change in the size of the right-sided nodule patient was wanting to have this biopsied and NEEDLE ASPIRATION result was benign  RECENT history:  Thyroid enlargement has been smaller compared to her initial visit in 2017  She has had no difficulty with swallowing   Also does not think she has any choking sensation in her neck or pressure sensation  Follow-up ultrasound in 11/2017 shows stability of all the nodules and she has a total of 8 nodules. Previously biopsied nodules are not increasing in size  The previously biopsied approximately 2.6 cm nodule within the mid/inferior pole the left lobe of the thyroid (labeled #7) is unchanged compared to the 05/2016 examination, previously, 2.3 cm with slight differences likely total to interval partial cystic degeneration.  Although the radiologist as recommended follow-up of 2 of the  nodules these are both about 1.1 cm in size and either stable or smaller than before  Her thyroid functions have been normal, checked by gynecologist  Lab Results  Component Value Date   TSH 1.35 07/31/2018   TSH 1.20 07/30/2017   TSH 1.05 05/23/2016   FREET4 1.21 05/23/2016     Allergies as of 11/29/2018      Reactions   Sulfa Antibiotics Swelling      Medication List       Accurate as of November 29, 2018  9:12 AM. Always use your most recent med list.        acyclovir 400 MG tablet Commonly known as:  ZOVIRAX Take 1 tablet (400 mg total) by mouth daily.   CALCIUM PO Take 4 tablets by mouth daily.   cholecalciferol 1000 units tablet Commonly known as:  VITAMIN D Take 10,000 Units by mouth once a week. TAKE 1 TABLET (1000 UNITS) BY MOUTH ONCE WEEKLY.   multivitamin tablet Take 1 tablet by mouth daily.   nortriptyline 50 MG capsule Commonly known as:  PAMELOR TAKE 1 CAPSULE (50 MG TOTAL) BY MOUTH AT BEDTIME       Allergies:  Allergies  Allergen Reactions  . Sulfa Antibiotics Swelling    Past Medical History:  Diagnosis Date  . Abnormal finding on Pap smear    Cervicitis  . Arthritis   . Elevated LFTs   . Endometrial polyp   . Gallstones   . GERD (gastroesophageal reflux  disease)   . Herpes progenitalis   . MVP (mitral valve prolapse)    antibiotic for dental procedures  . Osteopenia 08/2017   T score -1.4 FRAX 7.7% / 0.7%    Past Surgical History:  Procedure Laterality Date  . BIOPSY THYROID  07/2016  . CERVICAL FUSION    . COLPOSCOPY    . DILATION AND CURETTAGE OF UTERUS    . HYSTEROSCOPY    . LAPAROSCOPIC CHOLECYSTECTOMY  09/03/2012  . NASAL SEPTUM SURGERY    . NASAL SEPTUM SURGERY  1974  . SHOULDER SURGERY    . TONSILLECTOMY    . TONSILLECTOMY  1965  . UTERINE FIBROID SURGERY  2005    Family History  Adopted: Yes  Problem Relation Age of Onset  . Colon cancer Mother   . Hypertension Mother   . Osteoporosis Mother   . Cancer  Mother   . Kidney failure Father   . Heart disease Sister   . Osteoporosis Sister   . Diabetes Maternal Grandmother   . Heart disease Maternal Grandfather   . Esophageal cancer Neg Hx   . Rectal cancer Neg Hx   . Stomach cancer Neg Hx   . Breast cancer Neg Hx     Social History:  reports that she has never smoked. She has never used smokeless tobacco. She reports current alcohol use of about 1.0 - 2.0 standard drinks of alcohol per week. She reports that she does not use drugs.    Review of Systems    Examination:   BP (!) 144/70 (BP Location: Left Arm, Patient Position: Sitting, Cuff Size: Normal)   Pulse (!) 106   Ht 5' 5.5" (1.664 m)   Wt 163 lb 9.6 oz (74.2 kg)   SpO2 98%   BMI 26.81 kg/m  ,          THYROID:   Right side is palpable with somewhat increased difficulty and only about 1-1/2 times normal, firm Right side is enlarged about twice normal and nodular, no distinct nodule  No cervical lymphadenopathy  Assessment/Plan:  Multinodular goiter with multiple nodules on each side, discovered incidentally on CT scan done for other reasons.  Goiter is further decreased in size on exam on the right side and appears to be spontaneously getting smaller compared to her initial visit She has had negative biopsies previously  Her thyroid nodules are stable in size on follow-up ultrasound  Thyroid functions have been consistently normal  Recommended that she just have a clinical follow-up in 2 years now and no further ultrasounds unless there is any change on exam or symptoms    Elayne Snare 11/29/2018

## 2018-11-29 ENCOUNTER — Ambulatory Visit: Payer: 59 | Admitting: Endocrinology

## 2018-11-29 ENCOUNTER — Other Ambulatory Visit: Payer: Self-pay

## 2018-11-29 ENCOUNTER — Encounter: Payer: Self-pay | Admitting: Endocrinology

## 2018-11-29 VITALS — BP 144/70 | HR 106 | Ht 65.5 in | Wt 163.6 lb

## 2018-11-29 DIAGNOSIS — E042 Nontoxic multinodular goiter: Secondary | ICD-10-CM

## 2018-12-10 ENCOUNTER — Ambulatory Visit (INDEPENDENT_AMBULATORY_CARE_PROVIDER_SITE_OTHER): Payer: 59 | Admitting: Clinical

## 2018-12-10 DIAGNOSIS — F419 Anxiety disorder, unspecified: Secondary | ICD-10-CM | POA: Diagnosis not present

## 2018-12-12 ENCOUNTER — Other Ambulatory Visit: Payer: Self-pay | Admitting: Obstetrics & Gynecology

## 2018-12-12 DIAGNOSIS — Z1231 Encounter for screening mammogram for malignant neoplasm of breast: Secondary | ICD-10-CM

## 2018-12-18 IMAGING — CT CT HEAD W/O CM
3 series · 16 of 47 positions shown, 19 images · non-contrast
Comparison: Head CT 10/23/2015.

CLINICAL DATA: 61-year-old female with hot sensation and nausea.
Syncope. Small laceration lateral to the left side of the eye.

EXAM:
CT HEAD WITHOUT CONTRAST
TECHNIQUE: Contiguous axial images were obtained from the base of the skull
through the vertex without intravenous contrast.

[Series 2: head wo · axial · 0.42mm/px · z∈[-131,+4]mm · 10 of 33 slices shown, 13 images]
[im 3/33  brain]
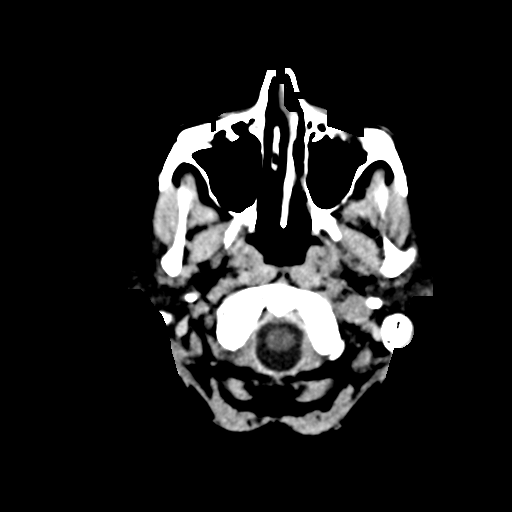
[im 3/33  bone]
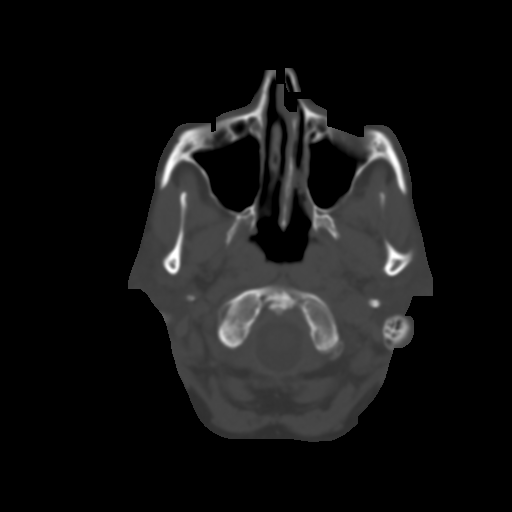
[im 6/33  brain]
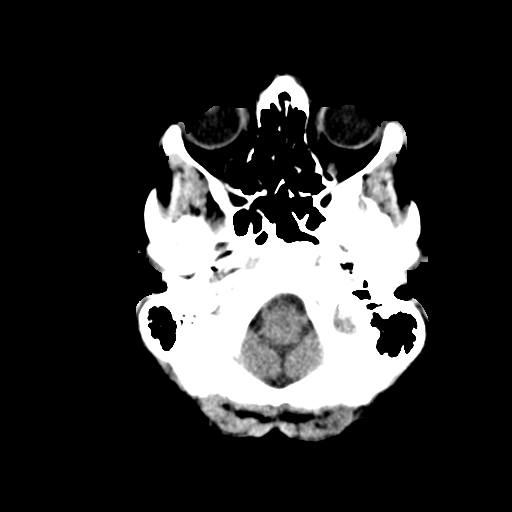
[im 9/33  brain]
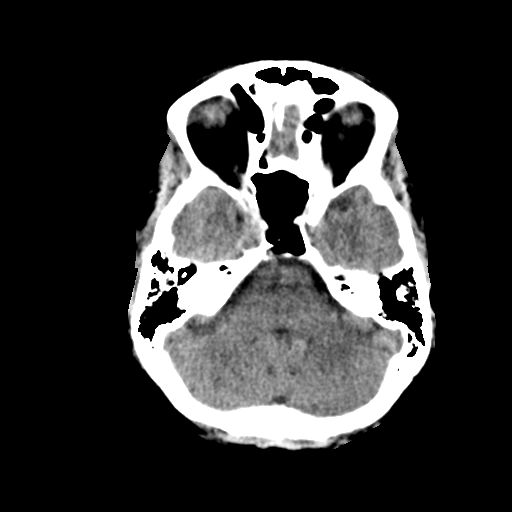
[im 12/33  brain]
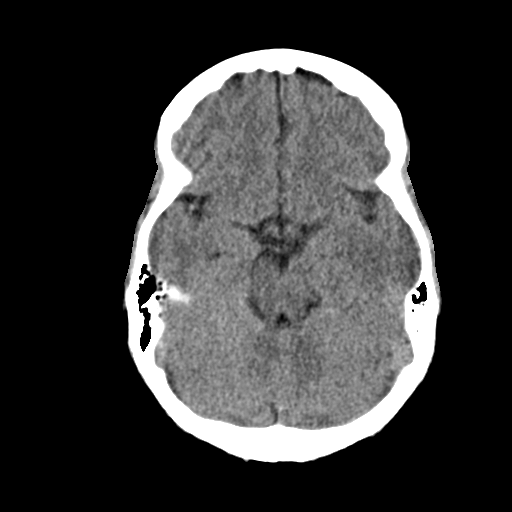
[im 15/33  brain]
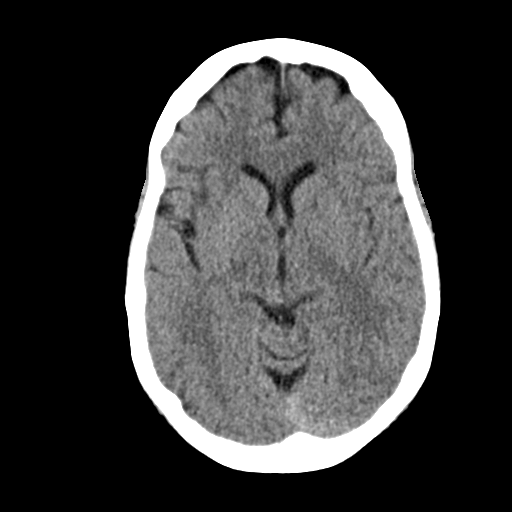
[im 15/33  bone]
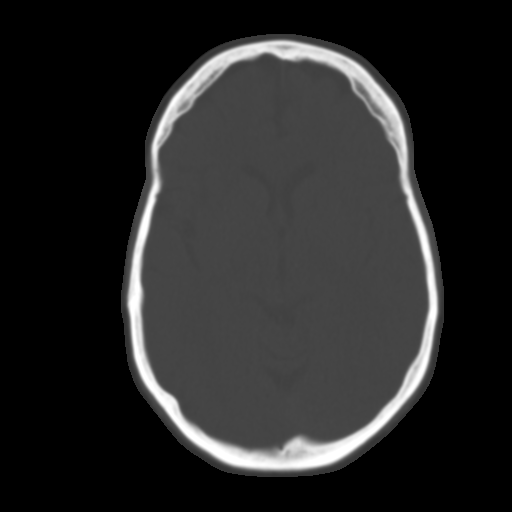
[im 18/33  brain]
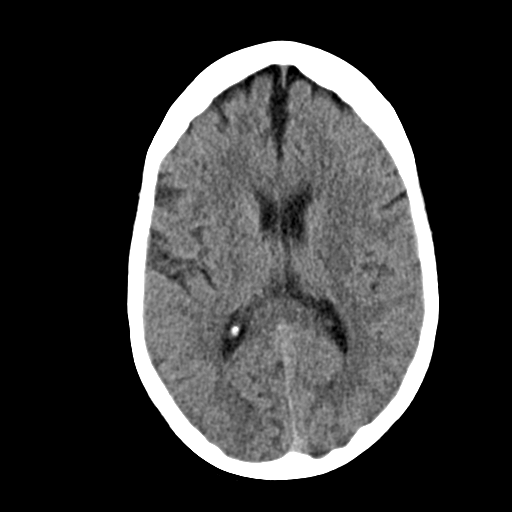
[im 21/33  brain]
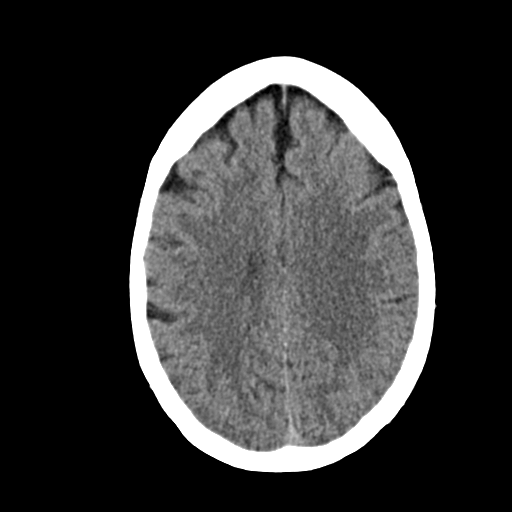
[im 25/33  brain]
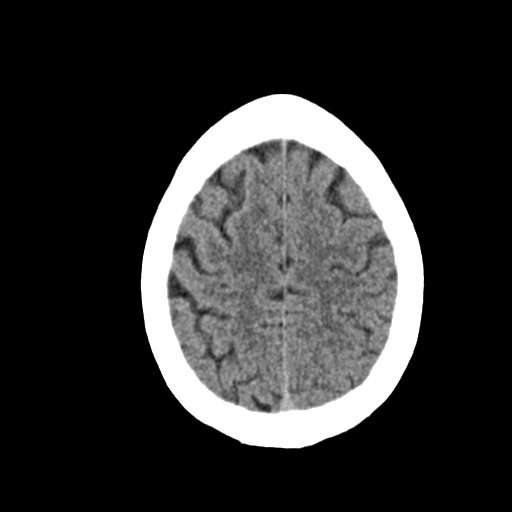
[im 27/33  brain]
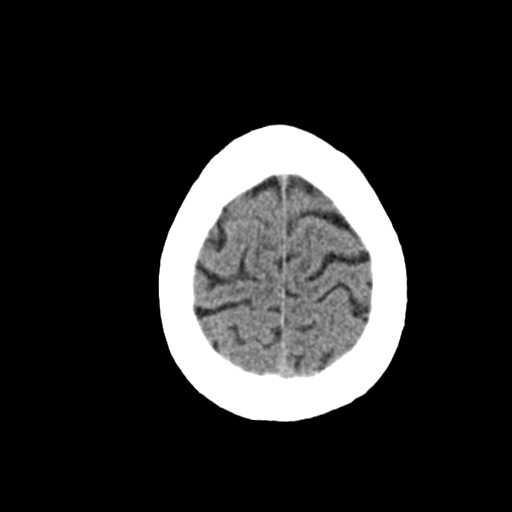
[im 27/33  bone]
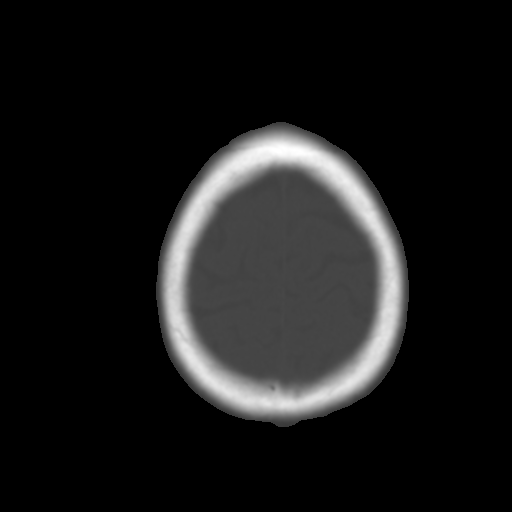
[im 30/33  brain]
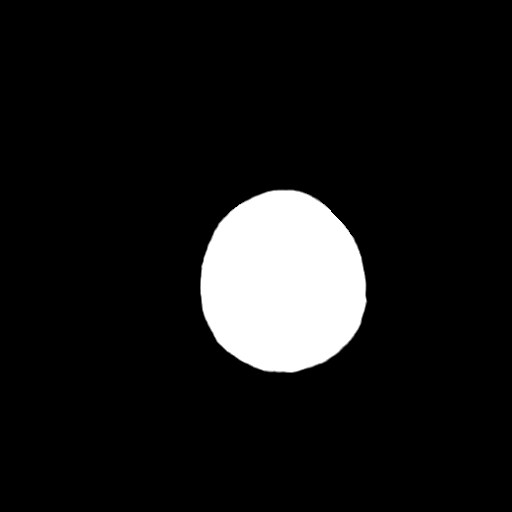

[Series 4: coronal soft · coronal · 0.31mm/px · 3 of 67 slices shown]
[im 23/67  brain]
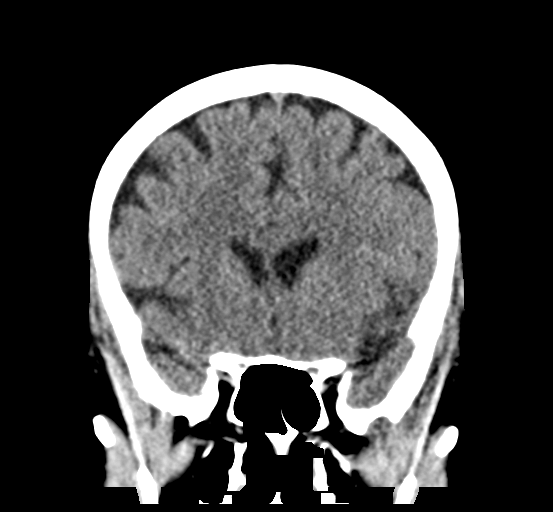
[im 30/67  brain]
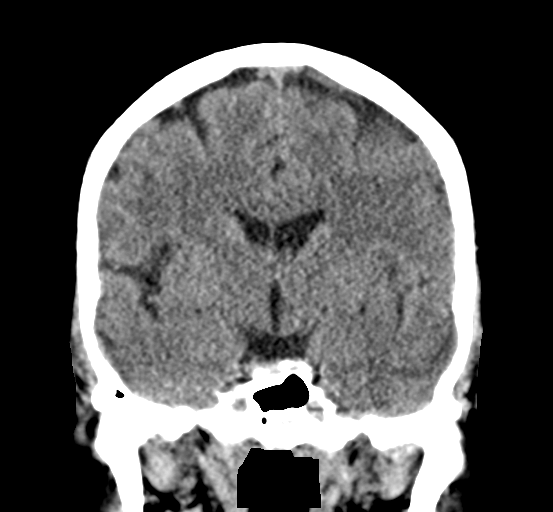
[im 37/67  brain]
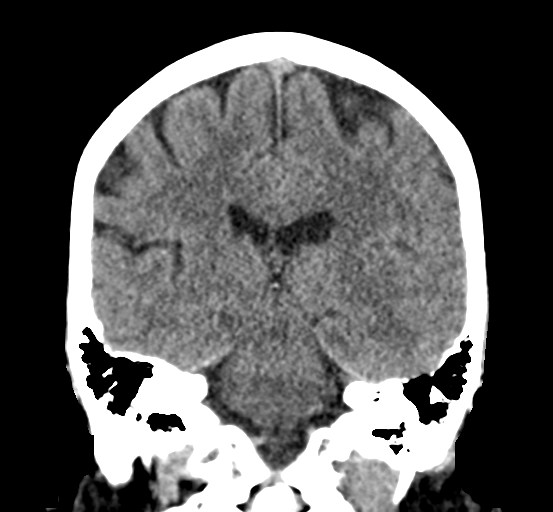

[Series 5: sag soft · sagittal · 0.31mm/px · 3 of 52 slices shown]
[im 18/52  brain]
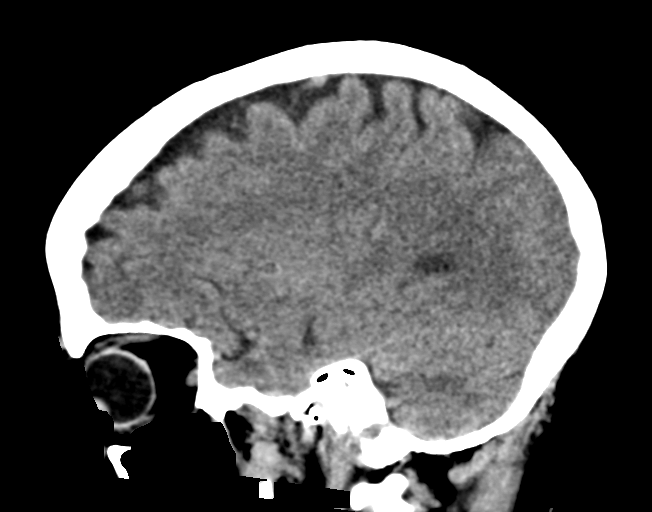
[im 26/52  brain]
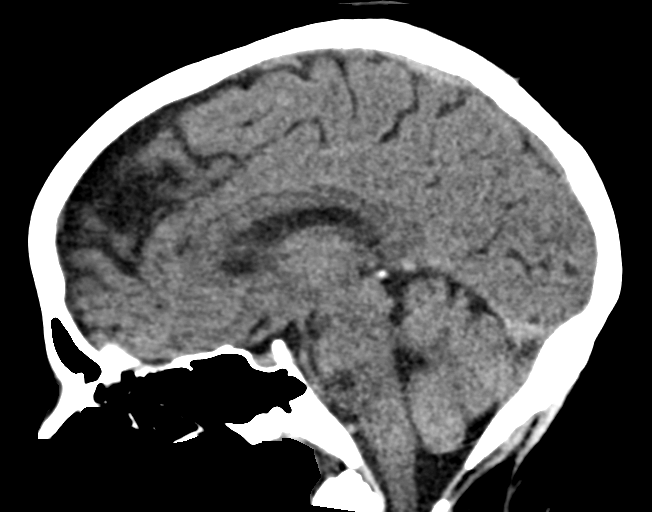
[im 35/52  brain]
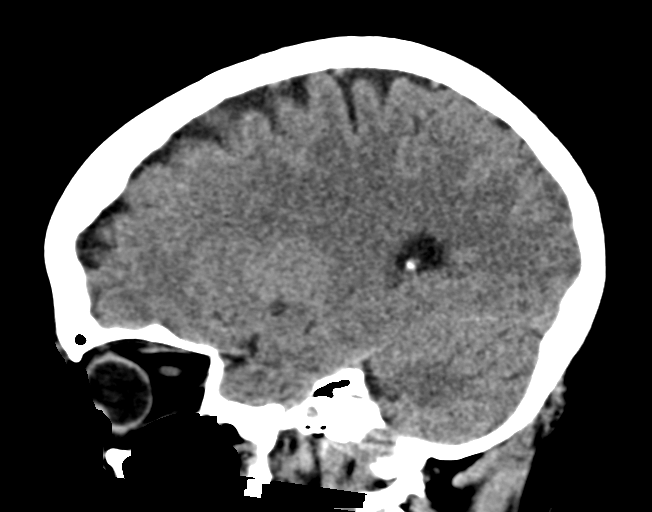

[16 of 47 positions shown; findings below may reference images not displayed]

FINDINGS: Brain: No evidence of acute infarction, hemorrhage, hydrocephalus,
extra-axial collection or mass lesion/mass effect.

Vascular: No hyperdense vessel or unexpected calcification.

Skull: Normal. Negative for fracture or focal lesion.

Sinuses/Orbits: No acute finding.

Other: None.
IMPRESSION: 1. No acute intracranial abnormalities.
2. The appearance of the brain is normal.

## 2019-01-23 ENCOUNTER — Ambulatory Visit: Payer: 59 | Admitting: Clinical

## 2019-01-27 ENCOUNTER — Ambulatory Visit: Payer: Self-pay

## 2019-02-28 ENCOUNTER — Ambulatory Visit (INDEPENDENT_AMBULATORY_CARE_PROVIDER_SITE_OTHER): Payer: 59 | Admitting: Clinical

## 2019-02-28 DIAGNOSIS — F419 Anxiety disorder, unspecified: Secondary | ICD-10-CM | POA: Diagnosis not present

## 2019-03-05 ENCOUNTER — Ambulatory Visit: Payer: Self-pay

## 2019-04-15 ENCOUNTER — Ambulatory Visit
Admission: RE | Admit: 2019-04-15 | Discharge: 2019-04-15 | Disposition: A | Discharge status: Home / Self Care | Payer: 59 | Source: Ambulatory Visit | Attending: Obstetrics & Gynecology | Admitting: Obstetrics & Gynecology

## 2019-04-15 DIAGNOSIS — Z1231 Encounter for screening mammogram for malignant neoplasm of breast: Secondary | ICD-10-CM

## 2019-04-25 ENCOUNTER — Ambulatory Visit (INDEPENDENT_AMBULATORY_CARE_PROVIDER_SITE_OTHER): Payer: 59 | Admitting: Clinical

## 2019-04-25 DIAGNOSIS — F419 Anxiety disorder, unspecified: Secondary | ICD-10-CM | POA: Diagnosis not present

## 2019-05-22 ENCOUNTER — Ambulatory Visit: Payer: 59 | Admitting: Sports Medicine

## 2019-05-22 ENCOUNTER — Ambulatory Visit
Admission: RE | Admit: 2019-05-22 | Discharge: 2019-05-22 | Disposition: A | Discharge status: Home / Self Care | Payer: 59 | Source: Ambulatory Visit | Attending: Sports Medicine | Admitting: Sports Medicine

## 2019-05-22 ENCOUNTER — Other Ambulatory Visit: Payer: Self-pay

## 2019-05-22 VITALS — BP 144/70 | Ht 65.0 in | Wt 158.0 lb

## 2019-05-22 DIAGNOSIS — M25511 Pain in right shoulder: Secondary | ICD-10-CM

## 2019-05-22 NOTE — Progress Notes (Addendum)
PCP: Camille Bal, PA-C  Subjective:   HPI: Patient is a 63 y.o. female here for shoulder pain.  The patient states she has been having this pain since October often goes from the right shoulder and radiates into her right ear.  The patient does have a history of cervical radiculopathy but states that this feels differently.  She says she has sharp pain in her shoulder with reaching her arm backward or across her body.  When the pain acts up and lasts for 15 to 20 minutes, then self resolves.  The nonradiating pain improves with activity and with cold compresses.  Advil is able to alleviate the pain but does not make it completely go away.  She has a history of a right shoulder impingement surgery in 2012 or 2013.  Past Medical History:  Diagnosis Date  . Abnormal finding on Pap smear    Cervicitis  . Arthritis   . Elevated LFTs   . Endometrial polyp   . Gallstones   . GERD (gastroesophageal reflux disease)   . Herpes progenitalis   . MVP (mitral valve prolapse)    antibiotic for dental procedures  . Osteopenia 08/2017   T score -1.4 FRAX 7.7% / 0.7%    Current Outpatient Medications on File Prior to Visit  Medication Sig Dispense Refill  . acyclovir (ZOVIRAX) 400 MG tablet Take 1 tablet (400 mg total) by mouth daily. 90 tablet 4  . CALCIUM PO Take 4 tablets by mouth daily.     . cholecalciferol (VITAMIN D) 1000 units tablet Take 10,000 Units by mouth once a week. TAKE 1 TABLET (1000 UNITS) BY MOUTH ONCE WEEKLY.    . Multiple Vitamin (MULTIVITAMIN) tablet Take 1 tablet by mouth daily.      . nortriptyline (PAMELOR) 50 MG capsule TAKE 1 CAPSULE (50 MG TOTAL) BY MOUTH AT BEDTIME  11   No current facility-administered medications on file prior to visit.     Past Surgical History:  Procedure Laterality Date  . BIOPSY THYROID  07/2016  . CERVICAL FUSION    . COLPOSCOPY    . DILATION AND CURETTAGE OF UTERUS    . HYSTEROSCOPY    . LAPAROSCOPIC CHOLECYSTECTOMY  09/03/2012  .  NASAL SEPTUM SURGERY    . NASAL SEPTUM SURGERY  1974  . SHOULDER SURGERY    . TONSILLECTOMY    . TONSILLECTOMY  1965  . UTERINE FIBROID SURGERY  2005    Allergies  Allergen Reactions  . Sulfa Antibiotics Swelling    Social History   Socioeconomic History  . Marital status: Married    Spouse name: Not on file  . Number of children: Not on file  . Years of education: Not on file  . Highest education level: Not on file  Occupational History  . Not on file  Social Needs  . Financial resource strain: Not on file  . Food insecurity    Worry: Not on file    Inability: Not on file  . Transportation needs    Medical: Not on file    Non-medical: Not on file  Tobacco Use  . Smoking status: Never Smoker  . Smokeless tobacco: Never Used  Substance and Sexual Activity  . Alcohol use: Yes    Alcohol/week: 1.0 - 2.0 standard drinks    Types: 1 - 2 Glasses of wine per week    Comment: occas  . Drug use: No  . Sexual activity: Yes    Partners: Male  Birth control/protection: Post-menopausal    Comment: 1st intercourse- 18, partners- 32, married- 64 yrs   Lifestyle  . Physical activity    Days per week: Not on file    Minutes per session: Not on file  . Stress: Not on file  Relationships  . Social Herbalist on phone: Not on file    Gets together: Not on file    Attends religious service: Not on file    Active member of club or organization: Not on file    Attends meetings of clubs or organizations: Not on file    Relationship status: Not on file  . Intimate partner violence    Fear of current or ex partner: Not on file    Emotionally abused: Not on file    Physically abused: Not on file    Forced sexual activity: Not on file  Other Topics Concern  . Not on file  Social History Narrative  . Not on file    Family History  Adopted: Yes  Problem Relation Age of Onset  . Colon cancer Mother   . Hypertension Mother   . Osteoporosis Mother   . Cancer Mother    . Kidney failure Father   . Heart disease Sister   . Osteoporosis Sister   . Diabetes Maternal Grandmother   . Heart disease Maternal Grandfather   . Esophageal cancer Neg Hx   . Rectal cancer Neg Hx   . Stomach cancer Neg Hx   . Breast cancer Neg Hx     BP (!) 144/70   Ht 5\' 5"  (1.651 m)   Wt 158 lb (71.7 kg)   BMI 26.29 kg/m   Review of Systems: See HPI above.     Objective:  Physical Exam:  Gen: NAD, comfortable in exam room Respiratory: Normal work of breathing, speaking in full sentences  LEFT Shoulder: Inspection: no obvious deformity, atrophy, or asymmetry. No bruising. No swelling Palpation: no TTP over [facility] joint or bicipital groove. ROM: full in flexion, abduction, internal and external rotation NV intact distally, Normal scapular function observed. Special Tests:  - Impingement: Neg Hawkins, neers, empty can sign. - Supraspinatous: Negative empty can.  5/5 strength with resisted flexion at 20 degrees - Infraspinatous/Teres Minor: 5/5 strength with ER - Subscapularis: 5/5 strength with IR - Biceps tendon: Yerrgason's  - Labrum: Negative Obriens - AC Joint: Negative cross arm - No painful arc and no drop arm sign  RIGHT Shoulder: Inspection: no obvious deformity, atrophy, or asymmetry. No bruising. No swelling Palpation: no TTP over [facility] joint or bicipital groove. ROM: full in flexion, abduction, internal and external rotation NV intact distally, Normal scapular function observed. Special Tests:  - Impingement: Neg Hawkins, Neers, and negative empty can sign. - Supraspinatous: Negative empty can. 5/5 strength with resisted flexion at 20 degrees - Infraspinatous/Teres Minor: 5/5 strength with ER - Subscapularis: 5/5 strength with IR - Biceps tendon: Yerrgason's  - Labrum: Negative Obriens - AC Joint: Negative cross arm - No painful arc and no drop arm sign   Assessment & Plan:  1. Right shoulder pain, chronic: patient with history of chronic right  shoulder pain that has not improved with conservative measures, including many months of physical therapy, no specific etiology appreciated on physical exam -Recommending further work up of shoulder pain, will start with X-Ray, will consider MRI if Xray normal -Follow up pending Xray results  Milus Banister, [place], PGY-2 05/22/2019 3:05 PM  Patient  seen and evaluated with the resident.  I agree with the above plan of care.  Patient presents with a complaint of right shoulder pain this been present for almost a year.  She has had extensive physical therapy with Dr. Barbaraann Barthel without improvement.  History and exam are concerning for possible labral tear.  I will start with getting an x-ray of her shoulder but if it is unremarkable we will need to proceed with MRI.  Phone follow-up with x-rays once available.  Addendum: X-rays unremarkable.

## 2019-05-26 NOTE — Addendum Note (Signed)
Addended by: Cyd Silence on: 05/26/2019 01:48 PM   Modules accepted: Orders

## 2019-06-24 ENCOUNTER — Ambulatory Visit
Admission: RE | Admit: 2019-06-24 | Discharge: 2019-06-24 | Disposition: A | Discharge status: Home / Self Care | Payer: 59 | Source: Ambulatory Visit | Attending: Sports Medicine | Admitting: Sports Medicine

## 2019-06-24 ENCOUNTER — Other Ambulatory Visit: Payer: Self-pay

## 2019-06-24 DIAGNOSIS — M25511 Pain in right shoulder: Secondary | ICD-10-CM

## 2019-06-26 ENCOUNTER — Other Ambulatory Visit: Payer: Self-pay

## 2019-06-26 ENCOUNTER — Ambulatory Visit (INDEPENDENT_AMBULATORY_CARE_PROVIDER_SITE_OTHER): Payer: 59 | Admitting: Sports Medicine

## 2019-06-26 VITALS — BP 152/90 | Ht 65.0 in | Wt 158.0 lb

## 2019-06-26 DIAGNOSIS — M25511 Pain in right shoulder: Secondary | ICD-10-CM | POA: Diagnosis not present

## 2019-06-26 MED ORDER — NITROGLYCERIN 0.2 MG/HR TD PT24: MEDICATED_PATCH | TRANSDERMAL | 1 refills | Status: DC

## 2019-06-26 NOTE — Patient Instructions (Signed)

## 2019-06-26 NOTE — Progress Notes (Signed)
  Patient comes in today to discuss MRI findings of the right shoulder.  MRI shows mild tendinosis of the supraspinatus tendon with a tiny insertional tear.  She also has a small tear of the posterior labrum.  I had a long talk with her about these findings and treatment going forward.  At this point I would not recommend surgical intervention.  Instead, I recommended either a subacromial cortisone injection or a trial of topical nitroglycerin.  Patient would like to try the trial of topical nitroglycerin.  She has had extensive physical therapy and is well versed in a home exercise program which I have encouraged her to continue doing.  Follow-up with me in 4 weeks for reevaluation.  If she finds no benefit from topical nitroglycerin then we may reconsider cortisone injection.

## 2019-07-01 ENCOUNTER — Encounter: Payer: Self-pay | Admitting: Gynecology

## 2019-07-29 ENCOUNTER — Other Ambulatory Visit: Payer: Self-pay

## 2019-07-29 ENCOUNTER — Ambulatory Visit (INDEPENDENT_AMBULATORY_CARE_PROVIDER_SITE_OTHER): Payer: 59 | Admitting: Sports Medicine

## 2019-07-29 VITALS — BP 124/82 | Ht 65.5 in | Wt 158.0 lb

## 2019-07-29 DIAGNOSIS — M25511 Pain in right shoulder: Secondary | ICD-10-CM

## 2019-07-30 NOTE — Progress Notes (Signed)
   Subjective:    Patient ID: Emily Brandt, female    DOB: 03/14/1956, 63 y.o.   MRN: [id number]  HPI   Patient comes in today for follow-up on right shoulder pain.  Overall symptoms are improving.  Lateral shoulder pain has improved with nitroglycerin.  She localizes today's pain primarily over the acromioclavicular joint.  MRI of the right shoulder done recently showed mild tendinosis of the supraspinatus tendon and a very small tear of the posterior labrum.   Review of Systems    As above Objective:   Physical Exam  Well-developed, well-nourished.  No acute distress.  Awake alert and oriented x3.  Vital signs reviewed.  Right shoulder: Full painless range of motion.  Good strength.  Mild tenderness to palpation over the acromioclavicular joint.  Negative O'Brien's.  Neurovascularly intact distally.  MRI of the right shoulder as above      Assessment & Plan:   Improving right shoulder pain secondary to rotator cuff tendinopathy  Given her improvement in lateral shoulder pain I recommended that she move her nitroglycerin patch to her area of maximum tenderness which is over the acromioclavicular joint.  She may discontinue the patch when her pain resolves.  I educated her on what exercises to avoid, especially any sort of loadbearing exercise for the shoulder.  I've also recommended that she start icing her acromioclavicular joint after exercise.  As long as her symptoms remain tolerable then she will follow-up with me as needed.  We did briefly discuss surgical referral if symptoms warrant down the road.

## 2019-08-06 ENCOUNTER — Other Ambulatory Visit: Payer: Self-pay

## 2019-08-07 ENCOUNTER — Ambulatory Visit (INDEPENDENT_AMBULATORY_CARE_PROVIDER_SITE_OTHER): Payer: 59 | Admitting: Obstetrics & Gynecology

## 2019-08-07 ENCOUNTER — Other Ambulatory Visit: Payer: Self-pay | Admitting: Obstetrics & Gynecology

## 2019-08-07 ENCOUNTER — Encounter: Payer: Self-pay | Admitting: Obstetrics & Gynecology

## 2019-08-07 VITALS — BP 160/94 | Ht 65.0 in | Wt 155.0 lb

## 2019-08-07 DIAGNOSIS — Z8619 Personal history of other infectious and parasitic diseases: Secondary | ICD-10-CM | POA: Diagnosis not present

## 2019-08-07 DIAGNOSIS — Z780 Asymptomatic menopausal state: Secondary | ICD-10-CM | POA: Diagnosis not present

## 2019-08-07 DIAGNOSIS — M8588 Other specified disorders of bone density and structure, other site: Secondary | ICD-10-CM

## 2019-08-07 DIAGNOSIS — M85852 Other specified disorders of bone density and structure, left thigh: Secondary | ICD-10-CM

## 2019-08-07 DIAGNOSIS — Z01419 Encounter for gynecological examination (general) (routine) without abnormal findings: Secondary | ICD-10-CM

## 2019-08-07 DIAGNOSIS — I10 Essential (primary) hypertension: Secondary | ICD-10-CM

## 2019-08-07 MED ORDER — ACYCLOVIR 400 MG PO TABS: 400.0000 mg | ORAL_TABLET | Freq: Every day | ORAL | 4 refills | Status: DC

## 2019-08-07 NOTE — Progress Notes (Signed)
Emily Brandt 07-30-1956 [id number]   History:    63 y.o. G1P0A1 Married.  RP:  Established patient presenting for annual gyn exam   HPI: Postmenopausal, well on no HRT.  No PMB.  No pelvic pain.  No pain with IC.  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  Urine/BMs normal.  Breasts normal.  BMI 25.79.  Needs to resume physical activities.  Health labs here today.  Past medical history,surgical history, family history and social history were all reviewed and documented in the EPIC chart.  Gynecologic History No LMP recorded. Patient is postmenopausal. Contraception: post menopausal status Last Pap: 07/2017. Results were: Negative Last mammogram: 04/2019. Results were: Negative Bone Density: 08/2017 Osteopenia Left Femoral Neck Colonoscopy: 2020  Obstetric History OB History  Gravida Para Term Preterm AB Living  1       1 0  SAB TAB Ectopic Multiple Live Births  1            # Outcome Date GA Lbr Len/2nd Weight Sex Delivery Anes PTL Lv  1 SAB              ROS: A ROS was performed and pertinent positives and negatives are included in the history.  GENERAL: No fevers or chills. HEENT: No change in vision, no earache, sore throat or sinus congestion. NECK: No pain or stiffness. CARDIOVASCULAR: No chest pain or pressure. No palpitations. PULMONARY: No shortness of breath, cough or wheeze. GASTROINTESTINAL: No abdominal pain, nausea, vomiting or diarrhea, melena or bright red blood per rectum. GENITOURINARY: No urinary frequency, urgency, hesitancy or dysuria. MUSCULOSKELETAL: No joint or muscle pain, no back pain, no recent trauma. DERMATOLOGIC: No rash, no itching, no lesions. ENDOCRINE: No polyuria, polydipsia, no heat or cold intolerance. No recent change in weight. HEMATOLOGICAL: No anemia or easy bruising or bleeding. NEUROLOGIC: No headache, seizures, numbness, tingling or weakness. PSYCHIATRIC: No depression, no loss of interest in normal activity or change in  sleep pattern.     Exam:   BP (!) 160/94   Ht '5\' 5"'$  (1.651 m)   Wt 155 lb (70.3 kg)   BMI 25.79 kg/m   Body mass index is 25.79 kg/m.  General appearance : Well developed well nourished female. No acute distress HEENT: Eyes: no retinal hemorrhage or exudates,  Neck supple, trachea midline, no carotid bruits, no thyroidmegaly Lungs: Clear to auscultation, no rhonchi or wheezes, or rib retractions  Heart: Regular rate and rhythm, no murmurs or gallops Breast:Examined in sitting and supine position were symmetrical in appearance, no palpable masses or tenderness,  no skin retraction, no nipple inversion, no nipple discharge, no skin discoloration, no axillary or supraclavicular lymphadenopathy Abdomen: no palpable masses or tenderness, no rebound or guarding Extremities: no edema or skin discoloration or tenderness  Pelvic: Vulva: Normal             Vagina: No gross lesions or discharge  Cervix: No gross lesions or discharge  Uterus  AV, normal size, shape and consistency, non-tender and mobile  Adnexa  Without masses or tenderness  Anus: Normal   Assessment/Plan:  63 y.o. female for annual exam   1. Well female exam with routine gynecological exam Normal gynecologic exam in menopause.  Pap test October 2018 was negative, will repeat next year.  Breast exam normal.  Screening mammogram July 2020 was negative.  Colonoscopy in 2020.  We will do health labs here today.  Body mass index 25.79.  Will reincrease physical activities and  continue with healthy nutrition. - CBC - Comp Met (CMET) - TSH - Lipid panel - VITAMIN D 25 Hydroxy (Vit-D Deficiency, Fractures)  2. Postmenopausal Well on no hormone replacement therapy.  No postmenopausal bleeding.  3. Osteopenia of neck of left femur Osteopenia on bone density November 2018.  We will repeat a bone density now.  Vitamin D supplements, calcium intake of 1200 mg daily and regular weightbearing physical activities. - DG Bone  Density; Future  4. History of herpes genitalis History of genital herpes doing well on a Cyclovir prophylaxis.  Acyclovir 400 mg daily represcribed.  5. Hypertension, unspecified type High blood pressure today at 160/94.  Low-salt diet recommended.  Will manage with her family physician.  Other orders - acyclovir (ZOVIRAX) 400 MG tablet; Take 1 tablet (400 mg total) by mouth daily. Prophylaxis  Princess Bruins MD, 10:39 AM 08/07/2019

## 2019-08-08 LAB — COMPREHENSIVE METABOLIC PANEL WITH GFR
AG Ratio: 1.9 (calc) (ref 1.0–2.5)
ALT: 22 U/L (ref 6–29)
AST: 25 U/L (ref 10–35)
Albumin: 4.6 g/dL (ref 3.6–5.1)
Alkaline phosphatase (APISO): 65 U/L (ref 37–153)
BUN: 13 mg/dL (ref 7–25)
CO2: 24 mmol/L (ref 20–32)
Calcium: 9.3 mg/dL (ref 8.6–10.4)
Chloride: 105 mmol/L (ref 98–110)
Creat: 0.84 mg/dL (ref 0.50–0.99)
Globulin: 2.4 g/dL (ref 1.9–3.7)
Glucose, Bld: 98 mg/dL (ref 65–99)
Potassium: 4 mmol/L (ref 3.5–5.3)
Sodium: 140 mmol/L (ref 135–146)
Total Bilirubin: 0.7 mg/dL (ref 0.2–1.2)
Total Protein: 7 g/dL (ref 6.1–8.1)

## 2019-08-08 LAB — CBC
HCT: 44.9 % (ref 35.0–45.0)
Hemoglobin: 15.2 g/dL (ref 11.7–15.5)
MCH: 31.5 pg (ref 27.0–33.0)
MCHC: 33.9 g/dL (ref 32.0–36.0)
MCV: 93 fL (ref 80.0–100.0)
MPV: 11.7 fL (ref 7.5–12.5)
Platelets: 270 10*3/uL (ref 140–400)
RBC: 4.83 10*6/uL (ref 3.80–5.10)
RDW: 11.8 % (ref 11.0–15.0)
WBC: 7.6 10*3/uL (ref 3.8–10.8)

## 2019-08-08 LAB — LIPID PANEL
Cholesterol: 180 mg/dL
HDL: 65 mg/dL
LDL Cholesterol (Calc): 91 mg/dL
Non-HDL Cholesterol (Calc): 115 mg/dL
Total CHOL/HDL Ratio: 2.8 (calc)
Triglycerides: 142 mg/dL

## 2019-08-08 LAB — TSH: TSH: 1.32 m[IU]/L (ref 0.40–4.50)

## 2019-08-08 LAB — VITAMIN D 25 HYDROXY (VIT D DEFICIENCY, FRACTURES): Vit D, 25-Hydroxy: 39 ng/mL (ref 30–100)

## 2019-08-09 ENCOUNTER — Encounter: Payer: Self-pay | Admitting: Obstetrics & Gynecology

## 2019-08-09 NOTE — Patient Instructions (Signed)
1. Well female exam with routine gynecological exam Normal gynecologic exam in menopause.  Pap test October 2018 was negative, will repeat next year.  Breast exam normal.  Screening mammogram July 2020 was negative.  Colonoscopy in 2020.  We will do health labs here today.  Body mass index 25.79.  Will reincrease physical activities and continue with healthy nutrition. - CBC - Comp Met (CMET) - TSH - Lipid panel - VITAMIN D 25 Hydroxy (Vit-D Deficiency, Fractures)  2. Postmenopausal Well on no hormone replacement therapy.  No postmenopausal bleeding.  3. Osteopenia of neck of left femur Osteopenia on bone density November 2018.  We will repeat a bone density now.  Vitamin D supplements, calcium intake of 1200 mg daily and regular weightbearing physical activities. - DG Bone Density; Future  4. History of herpes genitalis History of genital herpes doing well on a Cyclovir prophylaxis.  Acyclovir 400 mg daily represcribed.  5. Hypertension, unspecified type High blood pressure today at 160/94.  Low-salt diet recommended.  Will manage with her family physician.  Other orders - acyclovir (ZOVIRAX) 400 MG tablet; Take 1 tablet (400 mg total) by mouth daily. Prophylaxis  Hadyn, it was a pleasure seeing you today!  I will inform you of your results as soon as they are available.

## 2019-08-15 ENCOUNTER — Other Ambulatory Visit: Payer: Self-pay | Admitting: Sports Medicine

## 2019-08-26 ENCOUNTER — Other Ambulatory Visit: Payer: Self-pay

## 2019-08-27 ENCOUNTER — Ambulatory Visit (INDEPENDENT_AMBULATORY_CARE_PROVIDER_SITE_OTHER): Payer: 59

## 2019-08-27 ENCOUNTER — Other Ambulatory Visit: Payer: Self-pay | Admitting: Obstetrics & Gynecology

## 2019-08-27 DIAGNOSIS — Z780 Asymptomatic menopausal state: Secondary | ICD-10-CM

## 2019-08-27 DIAGNOSIS — M8589 Other specified disorders of bone density and structure, multiple sites: Secondary | ICD-10-CM | POA: Diagnosis not present

## 2019-08-27 DIAGNOSIS — M85852 Other specified disorders of bone density and structure, left thigh: Secondary | ICD-10-CM

## 2019-09-09 ENCOUNTER — Other Ambulatory Visit: Payer: Self-pay | Admitting: Sports Medicine

## 2019-10-09 ENCOUNTER — Emergency Department (HOSPITAL_BASED_OUTPATIENT_CLINIC_OR_DEPARTMENT_OTHER)
Admission: EM | Admit: 2019-10-09 | Discharge: 2019-10-09 | Disposition: A | Discharge status: Home / Self Care | Payer: 59 | Attending: Emergency Medicine | Admitting: Emergency Medicine

## 2019-10-09 ENCOUNTER — Other Ambulatory Visit: Payer: Self-pay

## 2019-10-09 ENCOUNTER — Encounter (HOSPITAL_BASED_OUTPATIENT_CLINIC_OR_DEPARTMENT_OTHER): Payer: Self-pay | Admitting: Emergency Medicine

## 2019-10-09 DIAGNOSIS — Z79899 Other long term (current) drug therapy: Secondary | ICD-10-CM | POA: Insufficient documentation

## 2019-10-09 DIAGNOSIS — S90572A Other superficial bite of ankle, left ankle, initial encounter: Secondary | ICD-10-CM | POA: Insufficient documentation

## 2019-10-09 DIAGNOSIS — I10 Essential (primary) hypertension: Secondary | ICD-10-CM | POA: Diagnosis not present

## 2019-10-09 DIAGNOSIS — Z203 Contact with and (suspected) exposure to rabies: Secondary | ICD-10-CM | POA: Diagnosis not present

## 2019-10-09 DIAGNOSIS — Y9301 Activity, walking, marching and hiking: Secondary | ICD-10-CM | POA: Insufficient documentation

## 2019-10-09 DIAGNOSIS — Y92007 Garden or yard of unspecified non-institutional (private) residence as the place of occurrence of the external cause: Secondary | ICD-10-CM | POA: Insufficient documentation

## 2019-10-09 DIAGNOSIS — W5501XA Bitten by cat, initial encounter: Secondary | ICD-10-CM | POA: Diagnosis not present

## 2019-10-09 DIAGNOSIS — Z23 Encounter for immunization: Secondary | ICD-10-CM | POA: Diagnosis not present

## 2019-10-09 DIAGNOSIS — Y999 Unspecified external cause status: Secondary | ICD-10-CM | POA: Insufficient documentation

## 2019-10-09 HISTORY — DX: Essential (primary) hypertension: I10

## 2019-10-09 MED ORDER — RABIES VACCINE, PCEC IM SUSR
1.0000 mL | Freq: Once | INTRAMUSCULAR | Status: AC
  Administered 2019-10-09: 1 mL via INTRAMUSCULAR
  Filled 2019-10-09: qty 1

## 2019-10-09 MED ORDER — AMOXICILLIN-POT CLAVULANATE 875-125 MG PO TABS: 1.0000 | ORAL_TABLET | Freq: Two times a day (BID) | ORAL | 0 refills | Status: DC

## 2019-10-09 MED ORDER — RABIES IMMUNE GLOBULIN 150 UNIT/ML IM INJ
20.0000 [IU]/kg | INJECTION | Freq: Once | INTRAMUSCULAR | Status: AC
  Administered 2019-10-09: 1425 [IU] via INTRAMUSCULAR
  Filled 2019-10-09: qty 10

## 2019-10-09 MED ORDER — AMOXICILLIN-POT CLAVULANATE 875-125 MG PO TABS
1.0000 | ORAL_TABLET | Freq: Once | ORAL | Status: AC
  Administered 2019-10-09: 1 via ORAL
  Filled 2019-10-09: qty 1

## 2019-10-09 MED ORDER — TETANUS-DIPHTH-ACELL PERTUSSIS 5-2.5-18.5 LF-MCG/0.5 IM SUSP
0.5000 mL | Freq: Once | INTRAMUSCULAR | Status: AC
  Administered 2019-10-09: 0.5 mL via INTRAMUSCULAR
  Filled 2019-10-09: qty 0.5

## 2019-10-09 NOTE — ED Notes (Signed)
ED Provider at bedside. 

## 2019-10-09 NOTE — Discharge Instructions (Signed)
Follow-up with the urgent care for your ongoing rabies shots as directed.  Keep the wound clean and dry.  Wash it with soap and water at least once daily.  Return here as needed if you have any worsening symptoms or signs of infection.

## 2019-10-09 NOTE — ED Triage Notes (Signed)
Stray cat bite to L ankle.

## 2019-10-09 NOTE — ED Provider Notes (Signed)
[facility] EMERGENCY DEPARTMENT Provider Note   CSN: [id number] Arrival date & time: 10/09/19  1755     History Chief Complaint  Patient presents with  . Animal Bite    Emily Brandt is a 64 y.o. female.  Patient is a 64 year old female who presents after a cat bite.  She states that she has a feral cat around her house that usually follows her to the mailbox.  The cat was around her as she was walking to the mailbox today and then jumped on her and bit her left ankle.  She has some small bite wounds to her left ankle.  She said that her last tetanus shot was in 2013.  This happened shortly prior to arrival in the emergency department.  She has contacted animal control regarding the cat.  However the cat is not currently in the position of animal control.        Past Medical History:  Diagnosis Date  . Abnormal finding on Pap smear    Cervicitis  . Arthritis   . Elevated LFTs   . Endometrial polyp   . Gallstones   . GERD (gastroesophageal reflux disease)   . Herpes progenitalis   . Hypertension   . MVP (mitral valve prolapse)    antibiotic for dental procedures  . Osteopenia 08/2017   T score -1.4 FRAX 7.7% / 0.7%    Patient Active Problem List   Diagnosis Date Noted  . Primary osteoarthritis of both hands 05/15/2017  . Primary osteoarthritis of both hips 05/15/2017  . Primary osteoarthritis of both knees 05/15/2017  . DDD (degenerative disc disease), cervical s/p cervical fusion 05/15/2017  . DDD (degenerative disc disease), lumbar 05/15/2017  . ANA positive has had no clinical features of autoimmune disease  05/15/2017  . Osteopenia of multiple sites 05/15/2017  . History of goiter 05/15/2017  . Multinodular goiter 06/01/2016  . 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 Preseptal cellulitis 10/24/2015  . Abnormal finding on Pap smear   . Herpes progenitalis   . MVP (mitral valve prolapse)     Past Surgical History:  Procedure Laterality Date    . BIOPSY THYROID  07/2016  . CERVICAL FUSION    . COLPOSCOPY    . DILATION AND CURETTAGE OF UTERUS    . HYSTEROSCOPY    . LAPAROSCOPIC CHOLECYSTECTOMY  09/03/2012  . NASAL SEPTUM SURGERY    . NASAL SEPTUM SURGERY  1974  . SHOULDER SURGERY    . TONSILLECTOMY    . TONSILLECTOMY  1965  . UTERINE FIBROID SURGERY  2005     OB History    Gravida  1   Para      Term      Preterm      AB  1   Living  0     SAB  1   TAB      Ectopic      Multiple      Live Births              Family History  Adopted: Yes  Problem Relation Age of Onset  . Colon cancer Mother   . Hypertension Mother   . Osteoporosis Mother   . Cancer Mother   . Kidney failure Father   . Heart disease Sister   . Osteoporosis Sister   . Diabetes Maternal Grandmother   . Heart disease Maternal Grandfather   . Esophageal cancer Neg Hx   . Rectal cancer Neg Hx   .  Stomach cancer Neg Hx   . Breast cancer Neg Hx     Social History   Tobacco Use  . Smoking status: Never Smoker  . Smokeless tobacco: Never Used  Substance Use Topics  . Alcohol use: Yes    Alcohol/week: 1.0 - 2.0 standard drinks    Types: 1 - 2 Glasses of wine per week    Comment: occas  . Drug use: No    Home Medications Prior to Admission medications   Medication Sig Start Date End Date Taking? Authorizing Provider  acyclovir (ZOVIRAX) 400 MG tablet Take 1 tablet (400 mg total) by mouth daily. Prophylaxis 08/07/19   Princess Bruins, MD  amoxicillin-clavulanate (AUGMENTIN) 875-125 MG tablet Take 1 tablet by mouth every 12 (twelve) hours. 10/09/19   Malvin Johns, MD  CALCIUM PO Take 4 tablets by mouth daily.     [provider]  cholecalciferol (VITAMIN D) 1000 units tablet Take 10,000 Units by mouth once a week. TAKE 1 TABLET (1000 UNITS) BY MOUTH ONCE WEEKLY.    [provider]  Multiple Vitamin (MULTIVITAMIN) tablet Take 1 tablet by mouth daily.      [provider]  nitroGLYCERIN (NITRODUR  - DOSED IN MG/24 HR) 0.2 mg/hr patch USE 1/4 PATCH DAILY TO THE AFFECTED AREA. 08/18/19   Thurman Coyer, DO  nortriptyline (PAMELOR) 50 MG capsule TAKE 1 CAPSULE (50 MG TOTAL) BY MOUTH AT BEDTIME 09/15/15   [provider]    Allergies    Sulfa antibiotics  Review of Systems   Review of Systems  Constitutional: Negative for fever.  Gastrointestinal: Negative for nausea and vomiting.  Musculoskeletal: Negative for arthralgias, back pain, joint swelling and neck pain.  Skin: Positive for wound.  Neurological: Negative for weakness, numbness and headaches.    Physical Exam Updated Vital Signs BP (!) 151/100 (BP Location: Left Arm)   Pulse (!) 113   Temp 98.8 F (37.1 C) (Oral)   Resp 20   Ht 5\' 5"  (1.651 m)   Wt 71.7 kg   SpO2 98%   BMI 26.29 kg/m   Physical Exam Constitutional:      Appearance: She is well-developed.  HENT:     Head: Normocephalic and atraumatic.  Cardiovascular:     Rate and Rhythm: Normal rate.  Pulmonary:     Effort: Pulmonary effort is normal.  Musculoskeletal:        General: No tenderness.     Cervical back: Normal range of motion and neck supple.     Comments: Patient has 2 small bite wounds to the lateral aspect of her left lower leg just proximal to the ankle.  There is no underlying bony tenderness.  She is neurovascularly intact distally.  No bleeding or drainage from the wounds.  Skin:    General: Skin is warm and dry.  Neurological:     Mental Status: She is alert and oriented to person, place, and time.     ED Results / Procedures / Treatments   Labs (all labs ordered are listed, but only abnormal results are displayed) Labs Reviewed - No data to display  EKG None  Radiology No results found.  Procedures Procedures (including critical care time)  Medications Ordered in ED Medications  rabies immune globulin (HYPERAB/KEDRAB) injection 1,425 Units (1,425 Units Intramuscular Given 10/09/19 1848)  rabies vaccine  (RABAVERT) injection 1 mL (1 mL Intramuscular Given 10/09/19 1846)  Tdap (BOOSTRIX) injection 0.5 mL (0.5 mLs Intramuscular Given 10/09/19 1844)  amoxicillin-clavulanate (AUGMENTIN) 875-125  MG per tablet 1 tablet (1 tablet Oral Given 10/09/19 1844)    ED Course  I have reviewed the triage vital signs and the nursing notes.  Pertinent labs & imaging results that were available during my care of the patient were reviewed by me and considered in my medical decision making (see chart for details).    MDM Rules/Calculators/A&P                      Patient has a superficial bite wound from a cat.  She was started on the rabies series.  She was given rabies immunoglobulin.  Her tetanus shot was updated.  She was started on Augmentin.  She was given wound care instructions.  Return precautions were given. Final Clinical Impression(s) / ED Diagnoses Final diagnoses:  Cat bite, initial encounter  Need for post exposure prophylaxis for rabies    Rx / DC Orders ED Discharge Orders         Ordered    amoxicillin-clavulanate (AUGMENTIN) 875-125 MG tablet  Every 12 hours     10/09/19 1852           Malvin Johns, MD 10/09/19 1856

## 2019-10-09 NOTE — ED Notes (Signed)
Pt. Reports she has notified Personal assistant

## 2019-10-12 ENCOUNTER — Ambulatory Visit (HOSPITAL_COMMUNITY)
Admission: EM | Admit: 2019-10-12 | Discharge: 2019-10-12 | Disposition: A | Discharge status: Home / Self Care | Payer: 59 | Attending: Emergency Medicine | Admitting: Emergency Medicine

## 2019-10-12 DIAGNOSIS — Z23 Encounter for immunization: Secondary | ICD-10-CM | POA: Diagnosis not present

## 2019-10-12 DIAGNOSIS — Z203 Contact with and (suspected) exposure to rabies: Secondary | ICD-10-CM | POA: Diagnosis not present

## 2019-10-12 MED ORDER — RABIES VACCINE, PCEC IM SUSR
1.0000 mL | Freq: Once | INTRAMUSCULAR | Status: AC
  Administered 2019-10-12: 1 mL via INTRAMUSCULAR

## 2019-10-12 MED ORDER — RABIES VACCINE, PCEC IM SUSR
INTRAMUSCULAR | Status: AC
  Filled 2019-10-12: qty 1

## 2019-10-12 NOTE — ED Triage Notes (Signed)
Pt here for day 3 rabies vaccine.  Pt denies any issues at this time.

## 2019-10-16 ENCOUNTER — Other Ambulatory Visit: Payer: Self-pay

## 2019-10-16 ENCOUNTER — Ambulatory Visit (HOSPITAL_COMMUNITY)
Admission: EM | Admit: 2019-10-16 | Discharge: 2019-10-16 | Disposition: A | Discharge status: Home / Self Care | Payer: 59 | Attending: Internal Medicine | Admitting: Internal Medicine

## 2019-10-16 DIAGNOSIS — Z203 Contact with and (suspected) exposure to rabies: Secondary | ICD-10-CM

## 2019-10-16 DIAGNOSIS — Z23 Encounter for immunization: Secondary | ICD-10-CM

## 2019-10-16 MED ORDER — RABIES VACCINE, PCEC IM SUSR
1.0000 mL | Freq: Once | INTRAMUSCULAR | Status: AC
  Administered 2019-10-16: 1 mL via INTRAMUSCULAR

## 2019-10-16 MED ORDER — RABIES VACCINE, PCEC IM SUSR
INTRAMUSCULAR | Status: AC
  Filled 2019-10-16: qty 1

## 2019-10-16 NOTE — ED Triage Notes (Signed)
Pt presents for 3rd rabies injection.  No complaints of any pain or side effects.

## 2019-10-17 ENCOUNTER — Ambulatory Visit (INDEPENDENT_AMBULATORY_CARE_PROVIDER_SITE_OTHER)
Admission: RE | Admit: 2019-10-17 | Discharge: 2019-10-17 | Disposition: A | Discharge status: Home / Self Care | Payer: 59 | Source: Ambulatory Visit

## 2019-10-17 DIAGNOSIS — B9789 Other viral agents as the cause of diseases classified elsewhere: Secondary | ICD-10-CM | POA: Diagnosis not present

## 2019-10-17 DIAGNOSIS — J0110 Acute frontal sinusitis, unspecified: Secondary | ICD-10-CM

## 2019-10-17 MED ORDER — PREDNISONE 10 MG (21) PO TBPK: ORAL_TABLET | ORAL | 0 refills | Status: DC

## 2019-10-17 MED ORDER — CETIRIZINE HCL 10 MG PO TABS: 10.0000 mg | ORAL_TABLET | Freq: Every day | ORAL | 0 refills | Status: DC

## 2019-10-17 NOTE — ED Provider Notes (Signed)
Virtual Visit via Video Note:  Emily Brandt  initiated request for Telemedicine visit with [facility] Urgent Care team. I connected with Emily Brandt  on 10/19/2019 at 9:24 AM  for a synchronized telemedicine visit using a video enabled HIPPA compliant telemedicine application. I verified that I am speaking with Emily Brandt  using two identifiers. [name], NP  was physically located in a [facility] Urgent care site and Emily Brandt was located at a different location.   The limitations of evaluation and management by telemedicine as well as the availability of in-person appointments were discussed. Patient was informed that she  may incur a bill ( including co-pay) for this virtual visit encounter. Emily Brandt  expressed understanding and gave verbal consent to proceed with virtual visit.     History of Present Illness:Emily Brandt  is a 63 y.o. female presents with sinus pressure, sinus headache, nasal congestion. Recently took amoxicillin for animal bite and is currently receiving rabies injection. This has been present a week or more. She has been also using sudafed and saline spray. Tested for COVID and negative No cough, fever, chest pain, SOB.   Past Medical History:  Diagnosis Date  . Abnormal finding on Pap smear    Cervicitis  . Arthritis   . Elevated LFTs   . Endometrial polyp   . Gallstones   . GERD (gastroesophageal reflux disease)   . Herpes progenitalis   . Hypertension   . MVP (mitral valve prolapse)    antibiotic for dental procedures  . Osteopenia 08/2017   T score -1.4 FRAX 7.7% / 0.7%    Allergies  Allergen Reactions  . Sulfa Antibiotics Swelling        Observations/Objective: VITALS: Per patient if applicable, see vitals. GENERAL: Alert, appears well and in no acute distress. HEENT: Atraumatic, conjunctiva clear, no obvious abnormalities on inspection of external nose and ears. NECK: Normal movements of the head and  neck. CARDIOPULMONARY: No increased WOB. Speaking in clear sentences. I:E ratio WNL.  MS: Moves all visible extremities without noticeable abnormality. PSYCH: Pleasant and cooperative, well-groomed. Speech normal rate and rhythm. Affect is appropriate. Insight and judgement are appropriate. Attention is focused, linear, and appropriate.  NEURO: CN grossly intact. Oriented as arrived to appointment on time with no prompting. Moves both UE equally.  SKIN: No obvious lesions, wounds, erythema, or cyanosis noted on face or hands.     Assessment and Plan: sinusitis- pt recently finished amoxicillin  Will try prednisone and zyrtec She can continue the saline spray.  Follow up as needed for continued or worsening symptoms    Follow Up Instructions:Follow up as needed for continued or worsening symptoms     I discussed the assessment and treatment plan with the patient. The patient was provided an opportunity to ask questions and all were answered. The patient agreed with the plan and demonstrated an understanding of the instructions.   The patient was advised to call back or seek an in-person evaluation if the symptoms worsen or if the condition fails to improve as anticipated.    [name], NP  10/19/2019 9:24 AM         [name], NP 10/19/19 [phone]

## 2019-10-17 NOTE — Discharge Instructions (Signed)
Take the medication as prescribed Tylenol for fever as needed.  Saline spray

## 2019-10-23 ENCOUNTER — Ambulatory Visit (HOSPITAL_COMMUNITY)
Admission: EM | Admit: 2019-10-23 | Discharge: 2019-10-23 | Disposition: A | Discharge status: Home / Self Care | Payer: 59 | Attending: Internal Medicine | Admitting: Internal Medicine

## 2019-10-23 ENCOUNTER — Other Ambulatory Visit: Payer: Self-pay

## 2019-10-23 DIAGNOSIS — Z23 Encounter for immunization: Secondary | ICD-10-CM

## 2019-10-23 DIAGNOSIS — Z203 Contact with and (suspected) exposure to rabies: Secondary | ICD-10-CM | POA: Diagnosis not present

## 2019-10-23 MED ORDER — RABIES VACCINE, PCEC IM SUSR
INTRAMUSCULAR | Status: AC
  Filled 2019-10-23: qty 1

## 2019-10-23 MED ORDER — RABIES VACCINE, PCEC IM SUSR
1.0000 mL | Freq: Once | INTRAMUSCULAR | Status: AC
  Administered 2019-10-23: 09:00:00 1 mL via INTRAMUSCULAR

## 2019-10-23 NOTE — ED Triage Notes (Signed)
Pt here for day 14 rabies vaccine.  Pt denies any issues at this time.

## 2019-12-09 ENCOUNTER — Ambulatory Visit (INDEPENDENT_AMBULATORY_CARE_PROVIDER_SITE_OTHER): Payer: 59 | Admitting: Clinical

## 2019-12-09 DIAGNOSIS — F419 Anxiety disorder, unspecified: Secondary | ICD-10-CM | POA: Diagnosis not present

## 2019-12-18 ENCOUNTER — Ambulatory Visit: Payer: 59 | Attending: Internal Medicine

## 2019-12-18 DIAGNOSIS — Z23 Encounter for immunization: Secondary | ICD-10-CM

## 2019-12-18 NOTE — Progress Notes (Signed)
   Covid-19 Vaccination Clinic  Name:  Emily Brandt    MRN: [id number] DOB: [birth year]  12/18/2019  [name] was observed post Covid-19 immunization for 15 minutes without incident. She was provided with Vaccine Information Sheet and instruction to access the V-Safe system.   [name] was instructed to call 911 with any severe reactions post vaccine: Marland Kitchen Difficulty breathing  . Swelling of face and throat  . A fast heartbeat  . A bad rash all over body  . Dizziness and weakness   Immunizations Administered    Name Date Dose VIS Date Route   Pfizer COVID-19 Vaccine 12/18/2019 12:56 PM 0.3 mL 09/12/2019 Intramuscular   Manufacturer: [place]   Lot: [id number]   [place]: [id number]

## 2019-12-23 ENCOUNTER — Ambulatory Visit (INDEPENDENT_AMBULATORY_CARE_PROVIDER_SITE_OTHER): Payer: 59 | Admitting: Clinical

## 2019-12-23 DIAGNOSIS — F419 Anxiety disorder, unspecified: Secondary | ICD-10-CM

## 2019-12-29 ENCOUNTER — Ambulatory Visit: Payer: 59 | Admitting: Clinical

## 2020-01-14 ENCOUNTER — Ambulatory Visit: Payer: 59 | Attending: Internal Medicine

## 2020-01-14 DIAGNOSIS — Z23 Encounter for immunization: Secondary | ICD-10-CM

## 2020-01-14 NOTE — Progress Notes (Signed)
   Covid-19 Vaccination Clinic  Name:  Emily Brandt    MRN: [id number] DOB: 08-12-1956  01/14/2020  [name] was observed post Covid-19 immunization for 15 minutes without incident. She was provided with Vaccine Information Sheet and instruction to access the V-Safe system.   [name] was instructed to call 911 with any severe reactions post vaccine: Marland Kitchen Difficulty breathing  . Swelling of face and throat  . A fast heartbeat  . A bad rash all over body  . Dizziness and weakness   Immunizations Administered    Name Date Dose VIS Date Route   Pfizer COVID-19 Vaccine 01/14/2020  9:52 AM 0.3 mL 09/12/2019 Intramuscular   Manufacturer: [place]   Lot: [id number]   [place]: [id number]

## 2020-03-03 ENCOUNTER — Ambulatory Visit (INDEPENDENT_AMBULATORY_CARE_PROVIDER_SITE_OTHER): Payer: 59 | Admitting: Clinical

## 2020-03-03 DIAGNOSIS — F419 Anxiety disorder, unspecified: Secondary | ICD-10-CM | POA: Diagnosis not present

## 2020-03-08 ENCOUNTER — Other Ambulatory Visit: Payer: Self-pay | Admitting: Obstetrics & Gynecology

## 2020-03-08 DIAGNOSIS — Z1231 Encounter for screening mammogram for malignant neoplasm of breast: Secondary | ICD-10-CM

## 2020-04-15 ENCOUNTER — Other Ambulatory Visit: Payer: Self-pay

## 2020-04-15 ENCOUNTER — Ambulatory Visit
Admission: RE | Admit: 2020-04-15 | Discharge: 2020-04-15 | Disposition: A | Discharge status: Home / Self Care | Payer: 59 | Source: Ambulatory Visit | Attending: Obstetrics & Gynecology | Admitting: Obstetrics & Gynecology

## 2020-04-15 DIAGNOSIS — Z1231 Encounter for screening mammogram for malignant neoplasm of breast: Secondary | ICD-10-CM

## 2020-05-26 ENCOUNTER — Ambulatory Visit (INDEPENDENT_AMBULATORY_CARE_PROVIDER_SITE_OTHER): Payer: 59 | Admitting: Clinical

## 2020-05-26 DIAGNOSIS — F419 Anxiety disorder, unspecified: Secondary | ICD-10-CM | POA: Diagnosis not present

## 2020-08-09 ENCOUNTER — Ambulatory Visit: Payer: 59 | Attending: Internal Medicine

## 2020-08-09 ENCOUNTER — Other Ambulatory Visit (HOSPITAL_BASED_OUTPATIENT_CLINIC_OR_DEPARTMENT_OTHER): Payer: Self-pay | Admitting: Internal Medicine

## 2020-08-09 DIAGNOSIS — Z23 Encounter for immunization: Secondary | ICD-10-CM

## 2020-08-09 NOTE — Progress Notes (Signed)
° °  Covid-19 Vaccination Clinic  Name:  Emily Brandt    MRN: [id number] DOB: 1956-01-01  08/09/2020  Ms. Emily Brandt was observed post Covid-19 immunization for 15 minutes without incident. She was provided with Vaccine Information Sheet and instruction to access the V-Safe system.   Ms. Emily Brandt was instructed to call 911 with any severe reactions post vaccine:  Difficulty breathing   Swelling of face and throat   A fast heartbeat   A bad rash all over body   Dizziness and weakness

## 2020-08-11 ENCOUNTER — Encounter: Payer: 59 | Admitting: Obstetrics & Gynecology

## 2020-08-13 MED FILL — PFIZER-BIONTECH COVID-19 VA: 30 | 1 days supply | Qty: 0 | Fill #0

## 2020-09-02 ENCOUNTER — Other Ambulatory Visit: Payer: Self-pay | Admitting: Obstetrics & Gynecology

## 2020-09-30 ENCOUNTER — Other Ambulatory Visit: Payer: Self-pay | Admitting: Obstetrics & Gynecology

## 2020-10-11 ENCOUNTER — Encounter: Payer: Self-pay | Admitting: Obstetrics & Gynecology

## 2020-10-11 ENCOUNTER — Other Ambulatory Visit: Payer: Self-pay

## 2020-10-11 ENCOUNTER — Ambulatory Visit: Payer: 59 | Admitting: Obstetrics & Gynecology

## 2020-10-11 VITALS — BP 136/80 | Ht 65.0 in | Wt 163.0 lb

## 2020-10-11 DIAGNOSIS — Z01419 Encounter for gynecological examination (general) (routine) without abnormal findings: Secondary | ICD-10-CM

## 2020-10-11 DIAGNOSIS — E663 Overweight: Secondary | ICD-10-CM

## 2020-10-11 DIAGNOSIS — M85852 Other specified disorders of bone density and structure, left thigh: Secondary | ICD-10-CM | POA: Diagnosis not present

## 2020-10-11 DIAGNOSIS — Z8619 Personal history of other infectious and parasitic diseases: Secondary | ICD-10-CM | POA: Diagnosis not present

## 2020-10-11 DIAGNOSIS — Z780 Asymptomatic menopausal state: Secondary | ICD-10-CM | POA: Diagnosis not present

## 2020-10-11 MED ORDER — ACYCLOVIR 400 MG PO TABS: 400.0000 mg | ORAL_TABLET | Freq: Every day | ORAL | 4 refills | Status: DC

## 2020-10-11 NOTE — Progress Notes (Signed)
Emily Brandt 04/26/1956 [id number]   History:    65 y.o. G1P0A1 Married.  RP:  Established patient presenting for annual gyn exam   HPI: Postmenopausal, well on no HRT.  No PMB.  No pelvic pain.  No pain with IC.  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  Urine/BMs normal.  Breasts normal.  BMI increased to 27.12.  Needs to resume physical activities.  Health labs here today.   Past medical history,surgical history, family history and social history were all reviewed and documented in the EPIC chart.  Gynecologic History No LMP recorded. Patient is postmenopausal.  Obstetric History OB History  Gravida Para Term Preterm AB Living  1       1 0  SAB IAB Ectopic Multiple Live Births  1            # Outcome Date GA Lbr Len/2nd Weight Sex Delivery Anes PTL Lv  1 SAB              ROS: A ROS was performed and pertinent positives and negatives are included in the history.  GENERAL: No fevers or chills. HEENT: No change in vision, no earache, sore throat or sinus congestion. NECK: No pain or stiffness. CARDIOVASCULAR: No chest pain or pressure. No palpitations. PULMONARY: No shortness of breath, cough or wheeze. GASTROINTESTINAL: No abdominal pain, nausea, vomiting or diarrhea, melena or bright red blood per rectum. GENITOURINARY: No urinary frequency, urgency, hesitancy or dysuria. MUSCULOSKELETAL: No joint or muscle pain, no back pain, no recent trauma. DERMATOLOGIC: No rash, no itching, no lesions. ENDOCRINE: No polyuria, polydipsia, no heat or cold intolerance. No recent change in weight. HEMATOLOGICAL: No anemia or easy bruising or bleeding. NEUROLOGIC: No headache, seizures, numbness, tingling or weakness. PSYCHIATRIC: No depression, no loss of interest in normal activity or change in sleep pattern.     Exam:   BP 136/80   Ht $R'5\' 5"'hO$  (1.651 m)   Wt 163 lb (73.9 kg)   BMI 27.12 kg/m   Body mass index is 27.12 kg/m.  General appearance : Well developed well  nourished female. No acute distress HEENT: Eyes: no retinal hemorrhage or exudates,  Neck supple, trachea midline, no carotid bruits, no thyroidmegaly Lungs: Clear to auscultation, no rhonchi or wheezes, or rib retractions  Heart: Regular rate and rhythm, no murmurs or gallops Breast:Examined in sitting and supine position were symmetrical in appearance, no palpable masses or tenderness,  no skin retraction, no nipple inversion, no nipple discharge, no skin discoloration, no axillary or supraclavicular lymphadenopathy Abdomen: no palpable masses or tenderness, no rebound or guarding Extremities: no edema or skin discoloration or tenderness  Pelvic: Vulva: Normal             Vagina: No gross lesions or discharge  Cervix: No gross lesions or discharge.  Pap reflex done.  Uterus  AV, normal size, shape and consistency, non-tender and mobile  Adnexa  Without masses or tenderness  Anus: Normal   Assessment/Plan:  65 y.o. female for annual exam   1. Encounter for routine gynecological examination with Papanicolaou smear of cervix Normal gynecologic exam in menopause.  Pap reflex done.  Breast exam normal.  Screening mammogram July 2021 was negative.  Colonoscopy 2020.  Fasting health labs here today. - CBC - Comp Met (CMET) - Lipid Profile - TSH - Vitamin D 1,25 dihydroxy  2. Postmenopausal Well on no hormone replacement therapy.  No postmenopausal bleeding.  3. Osteopenia of neck of left femur  Very mild osteopenia at the bilateral femoral necks on bone density in November 2020.  We will check vitamin D level today, supplements per results.  Calcium intake of 1500 mg daily recommended.  Regular weightbearing physical activities recommended.  4. H/O herpes genitalis Acyclovir prophylaxis prescribed.  5. Overweight (BMI 25.0-29.9) Recommend a lower calorie/carb diet.  Aerobic activities 5 times a week and light weightlifting every 2 days.  Other orders - acyclovir (ZOVIRAX) 400 MG  tablet; Take 1 tablet (400 mg total) by mouth daily. Prophylaxis  Princess Bruins MD, 11:03 AM 10/11/2020

## 2020-10-11 NOTE — Addendum Note (Signed)
Addended by: Thurnell Garbe A on: 10/11/2020 11:49 AM   Modules accepted: Orders

## 2020-10-12 LAB — PAP IG W/ RFLX HPV ASCU

## 2020-10-17 LAB — LIPID PANEL
Cholesterol: 182 mg/dL (ref <200)
HDL: 53 mg/dL (ref >=50)
LDL Cholesterol (Calc): 102 mg/dL (calc) — ABNORMAL HIGH
Non-HDL Cholesterol (Calc): 129 mg/dL (calc) (ref <130)
Total CHOL/HDL Ratio: 3.4 (calc) (ref <5.0)
Triglycerides: 170 mg/dL — ABNORMAL HIGH (ref <150)

## 2020-10-17 LAB — CBC
HCT: 43.4 % (ref 35.0–45.0)
Hemoglobin: 15 g/dL (ref 11.7–15.5)
MCH: 31.8 pg (ref 27.0–33.0)
MCHC: 34.6 g/dL (ref 32.0–36.0)
MCV: 92.1 fL (ref 80.0–100.0)
MPV: 11.5 fL (ref 7.5–12.5)
Platelets: 280 10*3/uL (ref 140–400)
RBC: 4.71 10*6/uL (ref 3.80–5.10)
RDW: 11.8 % (ref 11.0–15.0)
WBC: 7.1 10*3/uL (ref 3.8–10.8)

## 2020-10-17 LAB — COMPREHENSIVE METABOLIC PANEL
AG Ratio: 2 (calc) (ref 1.0–2.5)
ALT: 27 U/L (ref 6–29)
AST: 25 U/L (ref 10–35)
Albumin: 4.6 g/dL (ref 3.6–5.1)
Alkaline phosphatase (APISO): 65 U/L (ref 37–153)
BUN: 18 mg/dL (ref 7–25)
CO2: 28 mmol/L (ref 20–32)
Calcium: 9.8 mg/dL (ref 8.6–10.4)
Chloride: 104 mmol/L (ref 98–110)
Creat: 0.81 mg/dL (ref 0.50–0.99)
Globulin: 2.3 g/dL (calc) (ref 1.9–3.7)
Glucose, Bld: 110 mg/dL — ABNORMAL HIGH (ref 65–99)
Potassium: 4.1 mmol/L (ref 3.5–5.3)
Sodium: 140 mmol/L (ref 135–146)
Total Bilirubin: 0.6 mg/dL (ref 0.2–1.2)
Total Protein: 6.9 g/dL (ref 6.1–8.1)

## 2020-10-17 LAB — VITAMIN D 1,25 DIHYDROXY
Vitamin D 1, 25 (OH)2 Total: 28 pg/mL (ref 18–72)
Vitamin D2 1, 25 (OH)2: <8 pg/mL
Vitamin D3 1, 25 (OH)2: 28 pg/mL

## 2020-10-17 LAB — TSH: TSH: 1.55 mIU/L (ref 0.40–4.50)

## 2020-10-25 ENCOUNTER — Encounter: Payer: 59 | Admitting: Obstetrics & Gynecology

## 2021-03-30 ENCOUNTER — Other Ambulatory Visit: Payer: Self-pay | Admitting: Obstetrics & Gynecology

## 2021-03-30 DIAGNOSIS — Z1231 Encounter for screening mammogram for malignant neoplasm of breast: Secondary | ICD-10-CM

## 2021-05-24 ENCOUNTER — Ambulatory Visit: Payer: Medicare Other

## 2021-05-24 ENCOUNTER — Ambulatory Visit
Admission: RE | Admit: 2021-05-24 | Discharge: 2021-05-24 | Disposition: A | Discharge status: Home / Self Care | Payer: Medicare Other | Source: Ambulatory Visit | Attending: Obstetrics & Gynecology | Admitting: Obstetrics & Gynecology

## 2021-05-24 ENCOUNTER — Other Ambulatory Visit: Payer: Self-pay

## 2021-05-24 DIAGNOSIS — Z1231 Encounter for screening mammogram for malignant neoplasm of breast: Secondary | ICD-10-CM

## 2021-10-13 ENCOUNTER — Other Ambulatory Visit: Payer: Self-pay

## 2021-10-13 ENCOUNTER — Ambulatory Visit (INDEPENDENT_AMBULATORY_CARE_PROVIDER_SITE_OTHER): Payer: Medicare Other | Admitting: Obstetrics & Gynecology

## 2021-10-13 ENCOUNTER — Encounter: Payer: Self-pay | Admitting: Obstetrics & Gynecology

## 2021-10-13 VITALS — BP 136/84 | HR 98 | Resp 18 | Ht 64.76 in | Wt 161.0 lb

## 2021-10-13 DIAGNOSIS — M85852 Other specified disorders of bone density and structure, left thigh: Secondary | ICD-10-CM | POA: Diagnosis not present

## 2021-10-13 DIAGNOSIS — Z9189 Other specified personal risk factors, not elsewhere classified: Secondary | ICD-10-CM

## 2021-10-13 DIAGNOSIS — Z01419 Encounter for gynecological examination (general) (routine) without abnormal findings: Secondary | ICD-10-CM

## 2021-10-13 DIAGNOSIS — Z780 Asymptomatic menopausal state: Secondary | ICD-10-CM | POA: Diagnosis not present

## 2021-10-13 DIAGNOSIS — E663 Overweight: Secondary | ICD-10-CM | POA: Diagnosis not present

## 2021-10-13 DIAGNOSIS — Z8619 Personal history of other infectious and parasitic diseases: Secondary | ICD-10-CM

## 2021-10-13 LAB — CBC
HCT: 44.2 % (ref 35.0–45.0)
Hemoglobin: 14.8 g/dL (ref 11.7–15.5)
MCH: 31.2 pg (ref 27.0–33.0)
MCHC: 33.5 g/dL (ref 32.0–36.0)
MCV: 93.2 fL (ref 80.0–100.0)
MPV: 11.3 fL (ref 7.5–12.5)
Platelets: 291 10*3/uL (ref 140–400)
RBC: 4.74 10*6/uL (ref 3.80–5.10)
RDW: 12 % (ref 11.0–15.0)
WBC: 6.8 10*3/uL (ref 3.8–10.8)

## 2021-10-13 LAB — COMPREHENSIVE METABOLIC PANEL
AG Ratio: 1.6 (calc) (ref 1.0–2.5)
ALT: 30 U/L — ABNORMAL HIGH (ref 6–29)
AST: 27 U/L (ref 10–35)
Albumin: 4.3 g/dL (ref 3.6–5.1)
Alkaline phosphatase (APISO): 63 U/L (ref 37–153)
BUN: 17 mg/dL (ref 7–25)
CO2: 31 mmol/L (ref 20–32)
Calcium: 9.8 mg/dL (ref 8.6–10.4)
Chloride: 103 mmol/L (ref 98–110)
Creat: 0.79 mg/dL (ref 0.50–1.05)
Globulin: 2.7 g/dL (calc) (ref 1.9–3.7)
Glucose, Bld: 102 mg/dL — ABNORMAL HIGH (ref 65–99)
Potassium: 3.8 mmol/L (ref 3.5–5.3)
Sodium: 140 mmol/L (ref 135–146)
Total Bilirubin: 0.6 mg/dL (ref 0.2–1.2)
Total Protein: 7 g/dL (ref 6.1–8.1)

## 2021-10-13 LAB — TSH: TSH: 1.08 mIU/L (ref 0.40–4.50)

## 2021-10-13 LAB — LIPID PANEL
Cholesterol: 182 mg/dL (ref <200)
HDL: 57 mg/dL (ref >=50)
LDL Cholesterol (Calc): 101 mg/dL (calc) — ABNORMAL HIGH
Non-HDL Cholesterol (Calc): 125 mg/dL (calc) (ref <130)
Total CHOL/HDL Ratio: 3.2 (calc) (ref <5.0)
Triglycerides: 144 mg/dL (ref <150)

## 2021-10-13 LAB — VITAMIN D 25 HYDROXY (VIT D DEFICIENCY, FRACTURES): Vit D, 25-Hydroxy: 57 ng/mL (ref 30–100)

## 2021-10-13 MED ORDER — ACYCLOVIR 400 MG PO TABS: 400.0000 mg | ORAL_TABLET | Freq: Every day | ORAL | 4 refills | Status: DC

## 2021-10-13 NOTE — Progress Notes (Signed)
Emily Brandt 1956/03/18 [id number]   History:    66 y.o.  G1P0A1 Married.   RP:  Established patient presenting for annual gyn exam    HPI: Postmenopausal, well on no HRT.  No PMB.  No pelvic pain.  No pain with IC. Pap 10/2020 Neg.  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  Urine/BMs normal.  Breasts normal. Mammo 05/2021 Neg.  BMI increased to 26.99.  Good finess.  BD 08/2019 Osteopenia T-Score -1.3.  Health labs here today.  Colono 2020.  P-10/11/20, M-05/24/21, BD 08/27/19 with Osteopenia, left femoral neck T-Score -1.3.  Past medical history,surgical history, family history and social history were all reviewed and documented in the EPIC chart.  Gynecologic History No LMP recorded. Patient is postmenopausal.  Obstetric History OB History  Gravida Para Term Preterm AB Living  1       1 0  SAB IAB Ectopic Multiple Live Births  1            # Outcome Date GA Lbr Len/2nd Weight Sex Delivery Anes PTL Lv  1 SAB              ROS: A ROS was performed and pertinent positives and negatives are included in the history.  GENERAL: No fevers or chills. HEENT: No change in vision, no earache, sore throat or sinus congestion. NECK: No pain or stiffness. CARDIOVASCULAR: No chest pain or pressure. No palpitations. PULMONARY: No shortness of breath, cough or wheeze. GASTROINTESTINAL: No abdominal pain, nausea, vomiting or diarrhea, melena or bright red blood per rectum. GENITOURINARY: No urinary frequency, urgency, hesitancy or dysuria. MUSCULOSKELETAL: No joint or muscle pain, no back pain, no recent trauma. DERMATOLOGIC: No rash, no itching, no lesions. ENDOCRINE: No polyuria, polydipsia, no heat or cold intolerance. No recent change in weight. HEMATOLOGICAL: No anemia or easy bruising or bleeding. NEUROLOGIC: No headache, seizures, numbness, tingling or weakness. PSYCHIATRIC: No depression, no loss of interest in normal activity or change in sleep pattern.     Exam:   BP  136/84 (BP Location: Right Arm)    Pulse 98    Resp 18    Ht 5' 4.76" (1.645 m)    Wt 161 lb (73 kg)    BMI 26.99 kg/m   Body mass index is 26.99 kg/m.  General appearance : Well developed well nourished female. No acute distress HEENT: Eyes: no retinal hemorrhage or exudates,  Neck supple, trachea midline, no carotid bruits, no thyroidmegaly Lungs: Clear to auscultation, no rhonchi or wheezes, or rib retractions  Heart: Regular rate and rhythm, no murmurs or gallops Breast:Examined in sitting and supine position were symmetrical in appearance, no palpable masses or tenderness,  no skin retraction, no nipple inversion, no nipple discharge, no skin discoloration, no axillary or supraclavicular lymphadenopathy Abdomen: no palpable masses or tenderness, no rebound or guarding Extremities: no edema or skin discoloration or tenderness  Pelvic: Vulva: Normal             Vagina: No gross lesions or discharge  Cervix: No gross lesions or discharge  Uterus  AV, normal size, shape and consistency, non-tender and mobile  Adnexa  Without masses or tenderness  Anus: Normal   Assessment/Plan:  67 y.o. female for annual exam   1. Well female exam with routine gynecological exam Postmenopausal, well on no HRT.  No PMB.  No pelvic pain.  No pain with IC.  Pap 10/2020 Neg.  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  Urine/BMs normal.  Breasts normal. Mammo 05/2021 Neg.  BMI increased to 26.99.  Good finess.  BD 08/2019 Osteopenia T-Score -1.3.  Health labs here today.  Colono 2020. - CBC - Comp Met (CMET) - Lipid Profile - TSH - Vitamin D (25 hydroxy)  2. Postmenopausal Postmenopausal, well on no HRT.  No PMB.  No pelvic pain.  No pain with IC.    3. Osteopenia of neck of left femur  BD 08/2019 Osteopenia T-Score -1.3.  Schedule BD in 08/2022. - DG Bone Density; Future  4. H/O herpes genitalis Acyclovir prophylaxis prescribed.  5. Overweight (BMI 25.0-29.9) Continue with a low  calorie/carb diet and fitness activities.  Other orders - hydrochlorothiazide (MICROZIDE) 12.5 MG capsule; TAKE 1 CAPSULE BY MOUTH EVERY DAY IN THE MORNING - acyclovir (ZOVIRAX) 400 MG tablet; Take 1 tablet (400 mg total) by mouth daily. Prophylaxis   Princess Bruins MD, 11:00 AM 10/13/2021

## 2022-03-14 ENCOUNTER — Ambulatory Visit (INDEPENDENT_AMBULATORY_CARE_PROVIDER_SITE_OTHER): Payer: Medicare Other

## 2022-03-14 ENCOUNTER — Other Ambulatory Visit: Payer: Self-pay | Admitting: Obstetrics & Gynecology

## 2022-03-14 DIAGNOSIS — Z780 Asymptomatic menopausal state: Secondary | ICD-10-CM | POA: Diagnosis not present

## 2022-03-14 DIAGNOSIS — Z13820 Encounter for screening for osteoporosis: Secondary | ICD-10-CM

## 2022-03-14 DIAGNOSIS — M85852 Other specified disorders of bone density and structure, left thigh: Secondary | ICD-10-CM

## 2022-04-24 ENCOUNTER — Other Ambulatory Visit: Payer: Self-pay | Admitting: Obstetrics & Gynecology

## 2022-04-24 DIAGNOSIS — Z1231 Encounter for screening mammogram for malignant neoplasm of breast: Secondary | ICD-10-CM

## 2022-05-25 ENCOUNTER — Ambulatory Visit: Payer: Medicare Other

## 2022-06-07 ENCOUNTER — Ambulatory Visit
Admission: RE | Admit: 2022-06-07 | Discharge: 2022-06-07 | Disposition: A | Discharge status: Home / Self Care | Payer: Medicare Other | Source: Ambulatory Visit | Attending: Obstetrics & Gynecology | Admitting: Obstetrics & Gynecology

## 2022-06-07 DIAGNOSIS — Z1231 Encounter for screening mammogram for malignant neoplasm of breast: Secondary | ICD-10-CM

## 2022-06-09 ENCOUNTER — Other Ambulatory Visit: Payer: Self-pay | Admitting: Obstetrics & Gynecology

## 2022-06-09 DIAGNOSIS — R928 Other abnormal and inconclusive findings on diagnostic imaging of breast: Secondary | ICD-10-CM

## 2022-06-21 ENCOUNTER — Other Ambulatory Visit: Payer: Self-pay | Admitting: Obstetrics & Gynecology

## 2022-06-21 ENCOUNTER — Ambulatory Visit
Admission: RE | Admit: 2022-06-21 | Discharge: 2022-06-21 | Disposition: A | Discharge status: Home / Self Care | Payer: Medicare Other | Source: Ambulatory Visit | Attending: Obstetrics & Gynecology | Admitting: Obstetrics & Gynecology

## 2022-06-21 DIAGNOSIS — N6320 Unspecified lump in the left breast, unspecified quadrant: Secondary | ICD-10-CM

## 2022-06-21 DIAGNOSIS — R928 Other abnormal and inconclusive findings on diagnostic imaging of breast: Secondary | ICD-10-CM

## 2022-06-23 ENCOUNTER — Ambulatory Visit
Admission: RE | Admit: 2022-06-23 | Discharge: 2022-06-23 | Disposition: A | Discharge status: Home / Self Care | Payer: Medicare Other | Source: Ambulatory Visit | Attending: Obstetrics & Gynecology | Admitting: Obstetrics & Gynecology

## 2022-06-23 DIAGNOSIS — N6320 Unspecified lump in the left breast, unspecified quadrant: Secondary | ICD-10-CM

## 2022-06-26 ENCOUNTER — Other Ambulatory Visit: Payer: Self-pay | Admitting: Obstetrics & Gynecology

## 2022-06-26 DIAGNOSIS — N63 Unspecified lump in unspecified breast: Secondary | ICD-10-CM

## 2022-07-20 ENCOUNTER — Ambulatory Visit
Admission: RE | Admit: 2022-07-20 | Discharge: 2022-07-20 | Disposition: A | Discharge status: Home / Self Care | Payer: Medicare Other | Source: Ambulatory Visit | Attending: Obstetrics & Gynecology | Admitting: Obstetrics & Gynecology

## 2022-07-20 DIAGNOSIS — N63 Unspecified lump in unspecified breast: Secondary | ICD-10-CM

## 2022-07-25 ENCOUNTER — Other Ambulatory Visit: Payer: Self-pay | Admitting: General Surgery

## 2022-07-28 ENCOUNTER — Other Ambulatory Visit: Payer: Self-pay | Admitting: General Surgery

## 2022-07-28 DIAGNOSIS — N6320 Unspecified lump in the left breast, unspecified quadrant: Secondary | ICD-10-CM

## 2022-08-17 ENCOUNTER — Encounter (HOSPITAL_BASED_OUTPATIENT_CLINIC_OR_DEPARTMENT_OTHER): Payer: Self-pay | Admitting: General Surgery

## 2022-08-18 ENCOUNTER — Encounter (HOSPITAL_BASED_OUTPATIENT_CLINIC_OR_DEPARTMENT_OTHER)
Admission: RE | Admit: 2022-08-18 | Discharge: 2022-08-18 | Disposition: A | Discharge status: Home / Self Care | Payer: Medicare Other | Source: Ambulatory Visit | Attending: General Surgery | Admitting: General Surgery

## 2022-08-18 DIAGNOSIS — Z01818 Encounter for other preprocedural examination: Secondary | ICD-10-CM | POA: Diagnosis present

## 2022-08-18 DIAGNOSIS — I10 Essential (primary) hypertension: Secondary | ICD-10-CM | POA: Insufficient documentation

## 2022-08-18 LAB — BASIC METABOLIC PANEL
Anion gap: 6 (ref 5–15)
BUN: 15 mg/dL (ref 8–23)
CO2: 28 mmol/L (ref 22–32)
Calcium: 9.6 mg/dL (ref 8.9–10.3)
Chloride: 105 mmol/L (ref 98–111)
Creatinine, Ser: 0.8 mg/dL (ref 0.44–1.00)
GFR, Estimated: >60 mL/min (ref >60)
Glucose, Bld: 130 mg/dL — ABNORMAL HIGH (ref 70–99)
Potassium: 4.6 mmol/L (ref 3.5–5.1)
Sodium: 139 mmol/L (ref 135–145)

## 2022-08-18 MED ORDER — CHLORHEXIDINE GLUCONATE CLOTH 2 % EX PADS: 6.0000 | MEDICATED_PAD | Freq: Once | CUTANEOUS | Status: DC

## 2022-08-18 MED ORDER — ENSURE PRE-SURGERY PO LIQD: 296.0000 mL | Freq: Once | ORAL | Status: DC

## 2022-08-28 ENCOUNTER — Ambulatory Visit
Admission: RE | Admit: 2022-08-28 | Discharge: 2022-08-28 | Disposition: A | Discharge status: Home / Self Care | Payer: Medicare Other | Source: Ambulatory Visit | Attending: General Surgery | Admitting: General Surgery

## 2022-08-28 DIAGNOSIS — N6320 Unspecified lump in the left breast, unspecified quadrant: Secondary | ICD-10-CM

## 2022-08-28 HISTORY — PX: BREAST BIOPSY: SHX20

## 2022-08-29 ENCOUNTER — Encounter (HOSPITAL_BASED_OUTPATIENT_CLINIC_OR_DEPARTMENT_OTHER): Payer: Self-pay | Admitting: General Surgery

## 2022-08-29 ENCOUNTER — Other Ambulatory Visit: Payer: Self-pay

## 2022-08-29 ENCOUNTER — Ambulatory Visit (HOSPITAL_BASED_OUTPATIENT_CLINIC_OR_DEPARTMENT_OTHER): Payer: Medicare Other | Admitting: Anesthesiology

## 2022-08-29 ENCOUNTER — Encounter (HOSPITAL_BASED_OUTPATIENT_CLINIC_OR_DEPARTMENT_OTHER)
Admission: RE | Disposition: A | Discharge status: Home / Self Care | Payer: Self-pay | Source: Home / Self Care | Attending: General Surgery

## 2022-08-29 ENCOUNTER — Ambulatory Visit (HOSPITAL_BASED_OUTPATIENT_CLINIC_OR_DEPARTMENT_OTHER)
Admission: RE | Admit: 2022-08-29 | Discharge: 2022-08-29 | Disposition: A | Discharge status: Home / Self Care | Payer: Medicare Other | Attending: General Surgery | Admitting: General Surgery

## 2022-08-29 ENCOUNTER — Ambulatory Visit
Admission: RE | Admit: 2022-08-29 | Discharge: 2022-08-29 | Disposition: A | Discharge status: Home / Self Care | Payer: Medicare Other | Source: Ambulatory Visit | Attending: General Surgery | Admitting: General Surgery

## 2022-08-29 DIAGNOSIS — Z79899 Other long term (current) drug therapy: Secondary | ICD-10-CM | POA: Diagnosis not present

## 2022-08-29 DIAGNOSIS — N6320 Unspecified lump in the left breast, unspecified quadrant: Secondary | ICD-10-CM

## 2022-08-29 DIAGNOSIS — I10 Essential (primary) hypertension: Secondary | ICD-10-CM | POA: Insufficient documentation

## 2022-08-29 DIAGNOSIS — N6022 Fibroadenosis of left breast: Secondary | ICD-10-CM | POA: Diagnosis not present

## 2022-08-29 DIAGNOSIS — K219 Gastro-esophageal reflux disease without esophagitis: Secondary | ICD-10-CM | POA: Insufficient documentation

## 2022-08-29 DIAGNOSIS — M1990 Unspecified osteoarthritis, unspecified site: Secondary | ICD-10-CM | POA: Diagnosis not present

## 2022-08-29 DIAGNOSIS — N6489 Other specified disorders of breast: Secondary | ICD-10-CM | POA: Insufficient documentation

## 2022-08-29 DIAGNOSIS — Z01818 Encounter for other preprocedural examination: Secondary | ICD-10-CM

## 2022-08-29 DIAGNOSIS — N6321 Unspecified lump in the left breast, upper outer quadrant: Secondary | ICD-10-CM | POA: Diagnosis present

## 2022-08-29 HISTORY — PX: RADIOACTIVE SEED GUIDED EXCISIONAL BREAST BIOPSY: SHX6490

## 2022-08-29 SURGERY — RADIOACTIVE SEED GUIDED BREAST BIOPSY
Anesthesia: General | Site: Breast | Laterality: Left

## 2022-08-29 MED ORDER — PROPOFOL 10 MG/ML IV BOLUS
INTRAVENOUS | Status: DC | PRN
  Administered 2022-08-29: 150 mg via INTRAVENOUS

## 2022-08-29 MED ORDER — CEFAZOLIN SODIUM-DEXTROSE 2-4 GM/100ML-% IV SOLN: 2.0000 g | INTRAVENOUS | Status: DC

## 2022-08-29 MED ORDER — SUCCINYLCHOLINE CHLORIDE 200 MG/10ML IV SOSY
PREFILLED_SYRINGE | INTRAVENOUS | Status: AC
  Filled 2022-08-29: qty 10

## 2022-08-29 MED ORDER — PHENYLEPHRINE 80 MCG/ML (10ML) SYRINGE FOR IV PUSH (FOR BLOOD PRESSURE SUPPORT)
PREFILLED_SYRINGE | INTRAVENOUS | Status: AC
  Filled 2022-08-29: qty 10

## 2022-08-29 MED ORDER — FENTANYL CITRATE (PF) 100 MCG/2ML IJ SOLN
INTRAMUSCULAR | Status: AC
  Filled 2022-08-29: qty 2

## 2022-08-29 MED ORDER — LIDOCAINE HCL (CARDIAC) PF 100 MG/5ML IV SOSY
PREFILLED_SYRINGE | INTRAVENOUS | Status: DC | PRN
  Administered 2022-08-29: 60 mg via INTRAVENOUS

## 2022-08-29 MED ORDER — FENTANYL CITRATE (PF) 100 MCG/2ML IJ SOLN
INTRAMUSCULAR | Status: DC | PRN
  Administered 2022-08-29 (×2): 50 ug via INTRAVENOUS

## 2022-08-29 MED ORDER — ONDANSETRON HCL 4 MG/2ML IJ SOLN
INTRAMUSCULAR | Status: DC | PRN
  Administered 2022-08-29: 4 mg via INTRAVENOUS

## 2022-08-29 MED ORDER — BUPIVACAINE HCL (PF) 0.25 % IJ SOLN
INTRAMUSCULAR | Status: AC
  Filled 2022-08-29: qty 30

## 2022-08-29 MED ORDER — DEXAMETHASONE SODIUM PHOSPHATE 10 MG/ML IJ SOLN
INTRAMUSCULAR | Status: AC
  Filled 2022-08-29: qty 1

## 2022-08-29 MED ORDER — EPHEDRINE 5 MG/ML INJ
INTRAVENOUS | Status: AC
  Filled 2022-08-29: qty 5

## 2022-08-29 MED ORDER — CEFAZOLIN SODIUM-DEXTROSE 2-4 GM/100ML-% IV SOLN
2.0000 g | INTRAVENOUS | Status: AC
  Administered 2022-08-29: 2 g via INTRAVENOUS

## 2022-08-29 MED ORDER — CEFAZOLIN SODIUM-DEXTROSE 2-4 GM/100ML-% IV SOLN
INTRAVENOUS | Status: AC
  Filled 2022-08-29: qty 100

## 2022-08-29 MED ORDER — ACETAMINOPHEN 500 MG PO TABS
ORAL_TABLET | ORAL | Status: AC
  Filled 2022-08-29: qty 2

## 2022-08-29 MED ORDER — ONDANSETRON HCL 4 MG/2ML IJ SOLN
INTRAMUSCULAR | Status: AC
  Filled 2022-08-29: qty 2

## 2022-08-29 MED ORDER — ACETAMINOPHEN 500 MG PO TABS
1000.0000 mg | ORAL_TABLET | ORAL | Status: AC
  Administered 2022-08-29: 1000 mg via ORAL

## 2022-08-29 MED ORDER — LIDOCAINE 2% (20 MG/ML) 5 ML SYRINGE
INTRAMUSCULAR | Status: AC
  Filled 2022-08-29: qty 5

## 2022-08-29 MED ORDER — BUPIVACAINE HCL (PF) 0.25 % IJ SOLN
INTRAMUSCULAR | Status: DC | PRN
  Administered 2022-08-29: 10 mL

## 2022-08-29 MED ORDER — DEXAMETHASONE SODIUM PHOSPHATE 4 MG/ML IJ SOLN
INTRAMUSCULAR | Status: DC | PRN
  Administered 2022-08-29: 5 mg via INTRAVENOUS

## 2022-08-29 MED ORDER — MIDAZOLAM HCL 2 MG/2ML IJ SOLN
INTRAMUSCULAR | Status: AC
  Filled 2022-08-29: qty 2

## 2022-08-29 MED ORDER — ATROPINE SULFATE 0.4 MG/ML IV SOLN
INTRAVENOUS | Status: AC
  Filled 2022-08-29: qty 1

## 2022-08-29 MED ORDER — ACETAMINOPHEN 500 MG PO TABS: 1000.0000 mg | ORAL_TABLET | ORAL | Status: DC

## 2022-08-29 MED ORDER — LACTATED RINGERS IV SOLN: INTRAVENOUS | Status: DC

## 2022-08-29 SURGICAL SUPPLY — 54 items
ADH SKN CLS APL DERMABOND .7 (GAUZE/BANDAGES/DRESSINGS) ×1
APL PRP STRL LF DISP 70% ISPRP (MISCELLANEOUS) ×1
APPLIER CLIP 9.375 MED OPEN (MISCELLANEOUS)
APR CLP MED 9.3 20 MLT OPN (MISCELLANEOUS)
BINDER BREAST LRG (GAUZE/BANDAGES/DRESSINGS) IMPLANT
BINDER BREAST MEDIUM (GAUZE/BANDAGES/DRESSINGS) IMPLANT
BINDER BREAST XLRG (GAUZE/BANDAGES/DRESSINGS) IMPLANT
BINDER BREAST XXLRG (GAUZE/BANDAGES/DRESSINGS) IMPLANT
BLADE SURG 15 STRL LF DISP TIS (BLADE) ×1 IMPLANT
BLADE SURG 15 STRL SS (BLADE) ×1
CANISTER SUC SOCK COL 7IN (MISCELLANEOUS) IMPLANT
CANISTER SUCT 1200ML W/VALVE (MISCELLANEOUS) IMPLANT
CHLORAPREP W/TINT 26 (MISCELLANEOUS) ×1 IMPLANT
CLIP APPLIE 9.375 MED OPEN (MISCELLANEOUS) IMPLANT
CLIP TI WIDE RED SMALL 6 (CLIP) IMPLANT
COVER BACK TABLE 60X90IN (DRAPES) ×1 IMPLANT
COVER MAYO STAND STRL (DRAPES) ×1 IMPLANT
COVER PROBE CYLINDRICAL 5X96 (MISCELLANEOUS) ×1 IMPLANT
DERMABOND ADVANCED .7 DNX12 (GAUZE/BANDAGES/DRESSINGS) ×1 IMPLANT
DRAPE LAPAROSCOPIC ABDOMINAL (DRAPES) ×1 IMPLANT
DRAPE UTILITY XL STRL (DRAPES) ×1 IMPLANT
DRSG TEGADERM 4X4.75 (GAUZE/BANDAGES/DRESSINGS) IMPLANT
ELECT COATED BLADE 2.86 ST (ELECTRODE) ×1 IMPLANT
ELECT REM PT RETURN 9FT ADLT (ELECTROSURGICAL) ×1
ELECTRODE REM PT RTRN 9FT ADLT (ELECTROSURGICAL) ×1 IMPLANT
GAUZE SPONGE 4X4 12PLY STRL LF (GAUZE/BANDAGES/DRESSINGS) IMPLANT
GLOVE BIO SURGEON STRL SZ7 (GLOVE) ×2 IMPLANT
GLOVE BIOGEL PI IND STRL 7.5 (GLOVE) ×1 IMPLANT
GOWN STRL REUS W/ TWL LRG LVL3 (GOWN DISPOSABLE) ×2 IMPLANT
GOWN STRL REUS W/TWL LRG LVL3 (GOWN DISPOSABLE) ×2
HEMOSTAT ARISTA ABSORB 3G PWDR (HEMOSTASIS) IMPLANT
KIT MARKER MARGIN INK (KITS) ×1 IMPLANT
NDL HYPO 25X1 1.5 SAFETY (NEEDLE) ×1 IMPLANT
NEEDLE HYPO 25X1 1.5 SAFETY (NEEDLE) ×1 IMPLANT
NS IRRIG 1000ML POUR BTL (IV SOLUTION) IMPLANT
PACK BASIN DAY SURGERY FS (CUSTOM PROCEDURE TRAY) ×1 IMPLANT
PENCIL SMOKE EVACUATOR (MISCELLANEOUS) ×1 IMPLANT
RETRACTOR ONETRAX LX 90X20 (MISCELLANEOUS) IMPLANT
SLEEVE SCD COMPRESS KNEE MED (STOCKING) ×1 IMPLANT
SPIKE FLUID TRANSFER (MISCELLANEOUS) IMPLANT
SPONGE T-LAP 4X18 ~~LOC~~+RFID (SPONGE) ×1 IMPLANT
STRIP CLOSURE SKIN 1/2X4 (GAUZE/BANDAGES/DRESSINGS) ×1 IMPLANT
SUT MNCRL AB 4-0 PS2 18 (SUTURE) IMPLANT
SUT MON AB 5-0 PS2 18 (SUTURE) IMPLANT
SUT SILK 2 0 SH (SUTURE) IMPLANT
SUT VIC AB 2-0 SH 27 (SUTURE) ×1
SUT VIC AB 2-0 SH 27XBRD (SUTURE) ×1 IMPLANT
SUT VIC AB 3-0 SH 27 (SUTURE) ×1
SUT VIC AB 3-0 SH 27X BRD (SUTURE) ×1 IMPLANT
SYR CONTROL 10ML LL (SYRINGE) ×1 IMPLANT
TOWEL GREEN STERILE FF (TOWEL DISPOSABLE) ×1 IMPLANT
TRAY FAXITRON CT DISP (TRAY / TRAY PROCEDURE) ×1 IMPLANT
TUBE CONNECTING 20X1/4 (TUBING) IMPLANT
YANKAUER SUCT BULB TIP NO VENT (SUCTIONS) IMPLANT

## 2022-08-29 NOTE — Anesthesia Procedure Notes (Signed)
Procedure Name: LMA Insertion Date/Time: 08/29/2022 7:35 AM  Performed by: Willa Frater, CRNAPre-anesthesia Checklist: Patient identified, Emergency Drugs available, Suction available and Patient being monitored Patient Re-evaluated:Patient Re-evaluated prior to induction Oxygen Delivery Method: Circle system utilized Preoxygenation: Pre-oxygenation with 100% oxygen Induction Type: IV induction Ventilation: Mask ventilation without difficulty LMA: LMA inserted LMA Size: 4.0 Number of attempts: 1 Airway Equipment and Method: Bite block Placement Confirmation: positive ETCO2 Tube secured with: Tape Dental Injury: Teeth and Oropharynx as per pre-operative assessment

## 2022-08-29 NOTE — Anesthesia Postprocedure Evaluation (Signed)
Anesthesia Post Note  Patient: Emily Brandt  Procedure(s) Performed: RADIOACTIVE SEED GUIDED EXCISIONAL LEFT BREAST BIOPSY (Left: Breast)     Patient location during evaluation: PACU Anesthesia Type: General Level of consciousness: awake and alert Pain management: pain level controlled Vital Signs Assessment: post-procedure vital signs reviewed and stable Respiratory status: spontaneous breathing, nonlabored ventilation, respiratory function stable and patient connected to nasal cannula oxygen Cardiovascular status: blood pressure returned to baseline and stable Postop Assessment: no apparent nausea or vomiting Anesthetic complications: no  No notable events documented.  Last Vitals:  Vitals:   08/29/22 0837 08/29/22 0850  BP: 118/60 128/66  Pulse: 88 82  Resp: 12 18  Temp:  36.6 C  SpO2: 98% 96%    Last Pain:  Vitals:   08/29/22 0850  TempSrc: Oral  PainSc: 1                  Zyren Sevigny L Ashira Kirsten

## 2022-08-29 NOTE — Discharge Instructions (Addendum)
[place] Office Phone Number [phone]  POST OP INSTRUCTIONS Take 400 mg of ibuprofen every 8 hours or 650 mg tylenol every 6 hours for next 72 hours then as needed. Use ice several times daily also.  A prescription for pain medication may be given to you upon discharge.  Take your pain medication as prescribed, if needed.  If narcotic pain medicine is not needed, then you may take acetaminophen (Tylenol), naprosyn (Alleve) or ibuprofen (Advil) as needed. Take your usually prescribed medications unless otherwise directed If you need a refill on your pain medication, please contact your pharmacy.  They will contact our office to request authorization.  Prescriptions will not be filled after 5pm or on week-ends. You should eat very light the first 24 hours after surgery, such as soup, crackers, pudding, etc.  Resume your normal diet the day after surgery. Most patients will experience some swelling and bruising in the breast.  Ice packs and a good support bra will help.  Wear the breast binder provided or a sports bra for 72 hours day and night.  After that wear a sports bra during the day until you return to the office. Swelling and bruising can take several days to resolve.  It is common to experience some constipation if taking pain medication after surgery.  Increasing fluid intake and taking a stool softener will usually help or prevent this problem from occurring.  A mild laxative (Milk of Magnesia or Miralax) should be taken according to package directions if there are no bowel movements after 48 hours. I used skin glue on the incision, you may shower in 24 hours.  The glue will flake off over the next 2-3 weeks.  Any sutures or staples will be removed at the office during your follow-up visit. ACTIVITIES:  You may resume regular daily activities (gradually increasing) beginning the next day.  Wearing a good support bra or sports bra minimizes pain and swelling.  You may have  sexual intercourse when it is comfortable. You may drive when you no longer are taking prescription pain medication, you can comfortably wear a seatbelt, and you can safely maneuver your car and apply brakes. RETURN TO WORK:  ______________________________________________________________________________________ Dennis Bast should see your doctor in the office for a follow-up appointment approximately two weeks after your surgery.  Your doctor's nurse will typically make your follow-up appointment when she calls you with your pathology report.  Expect your pathology report 3-4 business days after your surgery.  You may call to check if you do not hear from [place] after three days. OTHER INSTRUCTIONS: _______________________________________________________________________________________________ _____________________________________________________________________________________________________________________________________ _____________________________________________________________________________________________________________________________________ _____________________________________________________________________________________________________________________________________  WHEN TO CALL DR [place]: Fever over 101.0 Nausea and/or vomiting. Extreme swelling or bruising. Continued bleeding from incision. Increased pain, redness, or drainage from the incision.  The clinic staff is available to answer your questions during regular business hours.  Please don't hesitate to call and ask to speak to one of the nurses for clinical concerns.  If you have a medical emergency, go to the nearest emergency room or call 911.  A surgeon from [facility] is always on call at the hospital.  For further questions, please visit [email or web address] May take Tylenol after 12:50pm, if needed.   Post Anesthesia Home Care Instructions  Activity: Get plenty of rest for the remainder of the  day. A responsible individual must stay with you for 24 hours following the procedure.  For the next 24 hours, DO NOT: -Drive a car -Paediatric nurse -Drink alcoholic  beverages -Take any medication unless instructed by your physician -Make any legal decisions or sign important papers.  Meals: Start with liquid foods such as gelatin or soup. Progress to regular foods as tolerated. Avoid greasy, spicy, heavy foods. If nausea and/or vomiting occur, drink only clear liquids until the nausea and/or vomiting subsides. Call your physician if vomiting continues.  Special Instructions/Symptoms: Your throat may feel dry or sore from the anesthesia or the breathing tube placed in your throat during surgery. If this causes discomfort, gargle with warm salt water. The discomfort should disappear within 24 hours.  If you had a scopolamine patch placed behind your ear for the management of post- operative nausea and/or vomiting:  1. The medication in the patch is effective for 72 hours, after which it should be removed.  Wrap patch in a tissue and discard in the trash. Wash hands thoroughly with soap and water. 2. You may remove the patch earlier than 72 hours if you experience unpleasant side effects which may include dry mouth, dizziness or visual disturbances. 3. Avoid touching the patch. Wash your hands with soap and water after contact with the patch.

## 2022-08-29 NOTE — Anesthesia Preprocedure Evaluation (Addendum)
Anesthesia Evaluation  Patient identified by MRN, date of birth, ID band Patient awake    Reviewed: Allergy & Precautions, NPO status , Patient's Chart, lab work & pertinent test results  Airway Mallampati: III  TM Distance: >3 FB Neck ROM: Full  Mouth opening: Limited Mouth Opening  Dental no notable dental hx. (+) Teeth Intact, Dental Advisory Given   Pulmonary neg pulmonary ROS   Pulmonary exam normal breath sounds clear to auscultation       Cardiovascular hypertension, Pt. on medications Normal cardiovascular exam+ Valvular Problems/Murmurs MVP  Rhythm:Regular Rate:Normal     Neuro/Psych negative neurological ROS  negative psych ROS   GI/Hepatic Neg liver ROS,GERD  Controlled,,  Endo/Other  negative endocrine ROS    Renal/GU negative Renal ROS  negative genitourinary   Musculoskeletal  (+) Arthritis ,    Abdominal   Peds  Hematology negative hematology ROS (+)   Anesthesia Other Findings   Reproductive/Obstetrics                             Anesthesia Physical Anesthesia Plan  ASA: 2  Anesthesia Plan: General   Post-op Pain Management: Tylenol PO (pre-op)*   Induction: Intravenous  PONV Risk Score and Plan: 3 and Ondansetron, Dexamethasone and Midazolam  Airway Management Planned: LMA  Additional Equipment:   Intra-op Plan:   Post-operative Plan: Extubation in OR  Informed Consent: I have reviewed the patients History and Physical, chart, labs and discussed the procedure including the risks, benefits and alternatives for the proposed anesthesia with the patient or authorized representative who has indicated his/her understanding and acceptance.     Dental advisory given  Plan Discussed with: CRNA  Anesthesia Plan Comments:        Anesthesia Quick Evaluation

## 2022-08-29 NOTE — Transfer of Care (Signed)
Immediate Anesthesia Transfer of Care Note  Patient: Emily Brandt  Procedure(s) Performed: RADIOACTIVE SEED GUIDED EXCISIONAL LEFT BREAST BIOPSY (Left: Breast)  Patient Location: PACU  Anesthesia Type:General  Level of Consciousness: awake, alert , oriented, drowsy, and patient cooperative  Airway & Oxygen Therapy: Patient Spontanous Breathing and Patient connected to face mask oxygen  Post-op Assessment: Report given to RN and Post -op Vital signs reviewed and stable  Post vital signs: Reviewed and stable  Last Vitals:  Vitals Value Taken Time  BP    Temp    Pulse    Resp    SpO2      Last Pain:  Vitals:   08/29/22 0647  TempSrc: Oral  PainSc: 0-No pain      Patients Stated Pain Goal: 3 (35/24/81 8590)  Complications: No notable events documented.

## 2022-08-29 NOTE — H&P (Signed)
  66 year old otherwise healthy female. She has no prior breast tissue. She is adopted so she does not know any of her family history. She had no mass or discharge. She underwent a screening mammogram. This showed a left breast asymmetry noted. On she also had B density breast. Additional imaging was performed that showed presence of a spiculated mass in the left breast. This measured 10 x 8 x 10 mm on ultrasound. The left axilla was negative. She underwent an ultrasound-guided biopsy where the clip was not exactly at the lesion was about 4.7 mm away. It appears that she was scheduled for a stereo biopsy that got the mass appropriately. This biopsy shows a complex sclerosing lesion with calcifications present. She was referred and recommended for excision.  Review of Systems: A complete review of systems was obtained from the patient. I have reviewed this information and discussed as appropriate with the patient. See HPI as well for other ROS.  Review of Systems All other systems reviewed and are negative.   Medical History: Past Medical History: Diagnosis Date Anxiety Arthritis Hypertension Thyroid disease   Past Surgical History: Procedure Laterality Date Nasal Surgery 1974 Uterine Fibroids Surgery 2003 Spinal Surgery 04/2009 Shoulder Surgery Right 10/2010 CHOLECYSTECTOMY 09/2012 TONSILLECTOMY   Allergies Allergen Reactions Sulfa (Sulfonamide Antibiotics) Swelling  Current Outpatient Medications on File Prior to Visit Medication Sig Dispense Refill acyclovir (ZOVIRAX) 400 MG tablet TAKE 1 TABLET (400 MG TOTAL) BY MOUTH DAILY. PROPHYLAXIS calcium carbonate 500 mg calcium (1,250 mg) tablet Take 4 tablets by mouth once daily cholecalciferol (VITAMIN D3) 1000 unit tablet Take by mouth hydroCHLOROthiazide (MICROZIDE) 12.5 mg capsule TAKE 1 CAPSULE BY MOUTH EVERY DAY IN THE MORNING FOR 90 DAYS magnesium oxide 400 mg magnesium Tab Take by mouth once daily multivitamin tablet Take 1  tablet by mouth once daily nortriptyline (PAMELOR) 10 MG capsule Nortriptyline nortriptyline (PAMELOR) 50 MG capsule TAKE 1 CAPSULE BY MOUTH EVERY DAY FOR 90 DAYS  History reviewed. No pertinent family history.  Social History  Tobacco Use Smoking Status Never Smokeless Tobacco Never Marital status: Married Tobacco Use Smoking status: Never Smokeless tobacco: Never Substance and Sexual Activity Alcohol use: Yes Drug use: Never  Objective:  Body mass index is 26.32 kg/m.  Physical Exam Vitals reviewed. Constitutional: Appearance: Normal appearance. Chest: Breasts: Right: No inverted nipple, mass or nipple discharge. Lymphadenopathy: Upper Body: Right upper body: No supraclavicular or axillary adenopathy. Neurological: Mental Status: She is alert.   Assessment and Plan:  Mass of upper outer quadrant of left breast  Left breast mass seed guided excisional biopsy  We discussed options as well as the findings on mammogram. I do think that with the presence of this mass and the pathology it is reasonable to consider excision although this is likely benign. She would like to proceed with this. We discussed a seed guided excisional biopsy with the risks, benefits, and recovery associated with that.

## 2022-08-29 NOTE — Interval H&P Note (Signed)
History and Physical Interval Note:  08/29/2022 7:08 AM  Emily Brandt  has presented today for surgery, with the diagnosis of Left Breast Mass.  The various methods of treatment have been discussed with the patient and family. After consideration of risks, benefits and other options for treatment, the patient has consented to  Procedure(s): RADIOACTIVE SEED GUIDED EXCISIONAL LEFT BREAST BIOPSY (Left) as a surgical intervention.  The patient's history has been reviewed, patient examined, no change in status, stable for surgery.  I have reviewed the patient's chart and labs.  Questions were answered to the patient's satisfaction.     Rolm Bookbinder

## 2022-08-29 NOTE — Op Note (Addendum)
Preoperative diagnosis: Left breast mass with core biopsy showing complex sclerosing lesion Postoperative diagnosis: Same as above Procedure: Left breast radioactive seed guided excisional biopsy Surgeon: Dr. Serita Grammes Anesthesia: General Estimated blood loss: Minimal Specimens: Left breast tissue marked with paint containing radioactive seed and BX clip Complications: None Drains: None Sponge needle count was correct at completion Disposition to recovery stable condition  Indications: This is a 66 year old female who underwent a mammogram that a left breast asymmetry.  Additional imaging showed a spiculated mass in the left breast measuring 10 mm by ultrasound.  She underwent ultrasound-guided biopsy that was discordant and the clip was not exactly at the lesion.  She then underwent a stereo biopsy that was able to sample the mass appropriately this shows a complex sclerosing lesion with calcifications.  She was referred for excision.  We discussed her options and elected to proceed with an excisional biopsy.  Procedure: After informed consent was obtained the patient was taken to the operating room.  She was given antibiotics.  SCDs were in place.  She was placed under general anesthesia without complication.  She was prepped and draped in the standard sterile surgical fashion.  A surgical timeout was then performed.  I located the seed in the central breast.  I made a periareolar incision in order to hide the scar later.  I then dissected down to the seed.  I remove the seed and some of the surrounding tissue.  There was a mass noted at that location.  I did not attempt to remove the other clip.  I then did a mammogram of the removed specimen and it contained the radioactive seed, clip as well as it appeared to contain the mass.  I then obtained hemostasis.  I closed the breast tissue down with 2-0 Vicryl.  The skin was closed with 3-0 Vicryl and 5-0 Monocryl.  Glue and Steri-Strips were  applied.  She tolerated this well was extubated and transferred to recovery stable.

## 2022-08-30 ENCOUNTER — Encounter (HOSPITAL_BASED_OUTPATIENT_CLINIC_OR_DEPARTMENT_OTHER): Payer: Self-pay | Admitting: General Surgery

## 2022-08-31 ENCOUNTER — Encounter (HOSPITAL_COMMUNITY): Payer: Self-pay

## 2022-08-31 LAB — SURGICAL PATHOLOGY

## 2022-10-14 ENCOUNTER — Other Ambulatory Visit: Payer: Self-pay | Admitting: Obstetrics & Gynecology

## 2022-10-16 NOTE — Telephone Encounter (Signed)
Med refill request: acyclovir 400 mg tab PO daily Last AEX: 10/13/21/ ML Next AEX: 11/14/22/ ML Last MMG (if hormonal med) N/A  Hx HSV 2  Rx pended #30/0RF  Refill authorized: Please Advise?

## 2022-11-14 ENCOUNTER — Encounter: Payer: Self-pay | Admitting: Obstetrics & Gynecology

## 2022-11-14 ENCOUNTER — Ambulatory Visit (INDEPENDENT_AMBULATORY_CARE_PROVIDER_SITE_OTHER): Payer: Medicare Other | Admitting: Obstetrics & Gynecology

## 2022-11-14 VITALS — BP 118/70 | HR 92 | Ht 64.75 in | Wt 155.0 lb

## 2022-11-14 DIAGNOSIS — Z780 Asymptomatic menopausal state: Secondary | ICD-10-CM | POA: Diagnosis not present

## 2022-11-14 DIAGNOSIS — Z9289 Personal history of other medical treatment: Secondary | ICD-10-CM | POA: Diagnosis not present

## 2022-11-14 DIAGNOSIS — Z9189 Other specified personal risk factors, not elsewhere classified: Secondary | ICD-10-CM | POA: Diagnosis not present

## 2022-11-14 DIAGNOSIS — B009 Herpesviral infection, unspecified: Secondary | ICD-10-CM | POA: Diagnosis not present

## 2022-11-14 DIAGNOSIS — Z01419 Encounter for gynecological examination (general) (routine) without abnormal findings: Secondary | ICD-10-CM

## 2022-11-14 DIAGNOSIS — Z8619 Personal history of other infectious and parasitic diseases: Secondary | ICD-10-CM | POA: Diagnosis not present

## 2022-11-14 MED ORDER — ACYCLOVIR 400 MG PO TABS: 400.0000 mg | ORAL_TABLET | Freq: Every day | ORAL | 4 refills | Status: DC

## 2022-11-14 NOTE — Progress Notes (Signed)
Emily Brandt Nov 17, 1955 [id number]   History:    67 y.o.  G1P0A1 Married.   RP:  Established patient presenting for annual gyn exam    HPI: Postmenopausal, well on no HRT.  No PMB.  No pelvic pain.  No pain with IC. Pap 10/2020 Neg.  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 Urine/BMs normal.  Breasts normal. Mammo 06/2022. Lt Dx mammo/US Mass at 12 O'Clock. Lt Breast Bx Benign 08/2022.  BMI increased to 25.99.  Good finess. BD 03/2022 Normal.  Health labs here today.  Colono 10/2018.    Past medical history,surgical history, family history and social history were all reviewed and documented in the EPIC chart.  Gynecologic History No LMP recorded. Patient is postmenopausal.  Obstetric History OB History  Gravida Para Term Preterm AB Living  1       1 0  SAB IAB Ectopic Multiple Live Births  1            # Outcome Date GA Lbr Len/2nd Weight Sex Delivery Anes PTL Lv  1 SAB              ROS: A ROS was performed and pertinent positives and negatives are included in the history. GENERAL: No fevers or chills. HEENT: No change in vision, no earache, sore throat or sinus congestion. NECK: No pain or stiffness. CARDIOVASCULAR: No chest pain or pressure. No palpitations. PULMONARY: No shortness of breath, cough or wheeze. GASTROINTESTINAL: No abdominal pain, nausea, vomiting or diarrhea, melena or bright red blood per rectum. GENITOURINARY: No urinary frequency, urgency, hesitancy or dysuria. MUSCULOSKELETAL: No joint or muscle pain, no back pain, no recent trauma. DERMATOLOGIC: No rash, no itching, no lesions. ENDOCRINE: No polyuria, polydipsia, no heat or cold intolerance. No recent change in weight. HEMATOLOGICAL: No anemia or easy bruising or bleeding. NEUROLOGIC: No headache, seizures, numbness, tingling or weakness. PSYCHIATRIC: No depression, no loss of interest in normal activity or change in sleep pattern.     Exam:  11/14/2022 9:57 AM  BP 118/70  Pulse 92  SpO2 98 %   Weight 155 lb (70.3 kg)  Height 5' 4.75" (1.645 m)   Other Vitals  BMI 25.99 kg/m2  BSA 1.79 m2     General appearance : Well developed well nourished female. No acute distress HEENT: Eyes: no retinal hemorrhage or exudates,  Neck supple, trachea midline, no carotid bruits, no thyroidmegaly Lungs: Clear to auscultation, no rhonchi or wheezes, or rib retractions  Heart: Regular rate and rhythm, no murmurs or gallops Breast:Examined in sitting and supine position were symmetrical in appearance, no palpable masses or tenderness,  no skin retraction, no nipple inversion, no nipple discharge, no skin discoloration, no axillary or supraclavicular lymphadenopathy Abdomen: no palpable masses or tenderness, no rebound or guarding Extremities: no edema or skin discoloration or tenderness  Pelvic: Vulva: Normal             Vagina: No gross lesions or discharge  Cervix: No gross lesions or discharge  Uterus  AV, normal size, shape and consistency, non-tender and mobile  Adnexa  Without masses or tenderness  Anus: Normal   Assessment/Plan:  67 y.o. female for annual exam   1. Well female exam with routine gynecological exam Postmenopausal, well on no HRT.  No PMB.  No pelvic pain.  No pain with IC. Pap 10/2020 Neg.  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 Urine/BMs normal.  Breasts normal. Mammo 06/2022. Lt Dx mammo/US Mass at 95  O'Clock. Lt Breast Bx Benign 08/2022.  BMI increased to 25.99.  Good finess. BD 03/2022 Normal.  Health labs here today.  [place] 10/2018. - CBC - Comp Met (CMET) - Lipid Profile - TSH - Vitamin D (25 hydroxy)  2. Postmenopausal Postmenopausal, well on no HRT.  No PMB.  No pelvic pain.  No pain with IC with lubricant.  Suggested coconut oil.  BD normal 03/2022, repeat at 5 years.  3. H/O herpes genitalis No recurrence with Acyclovir prophylaxis.  Prescription sent to pharmacy.  4. Personal history of other medical treatment  Other orders - traZODone  (DESYREL) 50 MG tablet - VITAMIN D PO; Take 5,000 Int'l Units by mouth. Takes 1 twice a week - acyclovir (ZOVIRAX) 400 MG tablet; Take 1 tablet (400 mg total) by mouth daily. Prophylaxis   Princess Bruins MD, 10:03 AM

## 2022-11-15 LAB — CBC
HCT: 42.1 % (ref 35.0–45.0)
Hemoglobin: 14.7 g/dL (ref 11.7–15.5)
MCH: 32.2 pg (ref 27.0–33.0)
MCHC: 34.9 g/dL (ref 32.0–36.0)
MCV: 92.3 fL (ref 80.0–100.0)
MPV: 12 fL (ref 7.5–12.5)
Platelets: 264 10*3/uL (ref 140–400)
RBC: 4.56 10*6/uL (ref 3.80–5.10)
RDW: 12.7 % (ref 11.0–15.0)
WBC: 7.9 10*3/uL (ref 3.8–10.8)

## 2022-11-15 LAB — LIPID PANEL
Cholesterol: 166 mg/dL (ref <200)
HDL: 72 mg/dL (ref >=50)
LDL Cholesterol (Calc): 72 mg/dL (calc)
Non-HDL Cholesterol (Calc): 94 mg/dL (calc) (ref <130)
Total CHOL/HDL Ratio: 2.3 (calc) (ref <5.0)
Triglycerides: 138 mg/dL (ref <150)

## 2022-11-15 LAB — TSH: TSH: 0.86 mIU/L (ref 0.40–4.50)

## 2022-11-15 LAB — COMPREHENSIVE METABOLIC PANEL
AG Ratio: 1.8 (calc) (ref 1.0–2.5)
ALT: 21 U/L (ref 6–29)
AST: 21 U/L (ref 10–35)
Albumin: 4.6 g/dL (ref 3.6–5.1)
Alkaline phosphatase (APISO): 54 U/L (ref 37–153)
BUN: 17 mg/dL (ref 7–25)
CO2: 29 mmol/L (ref 20–32)
Calcium: 9.8 mg/dL (ref 8.6–10.4)
Chloride: 104 mmol/L (ref 98–110)
Creat: 0.81 mg/dL (ref 0.50–1.05)
Globulin: 2.5 g/dL (calc) (ref 1.9–3.7)
Glucose, Bld: 110 mg/dL — ABNORMAL HIGH (ref 65–99)
Potassium: 4.1 mmol/L (ref 3.5–5.3)
Sodium: 142 mmol/L (ref 135–146)
Total Bilirubin: 0.6 mg/dL (ref 0.2–1.2)
Total Protein: 7.1 g/dL (ref 6.1–8.1)

## 2022-11-15 LAB — VITAMIN D 25 HYDROXY (VIT D DEFICIENCY, FRACTURES): Vit D, 25-Hydroxy: 50 ng/mL (ref 30–100)

## 2022-11-27 ENCOUNTER — Other Ambulatory Visit: Payer: Self-pay

## 2022-11-27 DIAGNOSIS — R7309 Other abnormal glucose: Secondary | ICD-10-CM

## 2022-11-28 ENCOUNTER — Other Ambulatory Visit: Payer: Medicare Other

## 2022-11-28 DIAGNOSIS — R7309 Other abnormal glucose: Secondary | ICD-10-CM

## 2022-11-29 LAB — HEMOGLOBIN A1C
Hgb A1c MFr Bld: 5.4 %{Hb}
Mean Plasma Glucose: 108 mg/dL
eAG (mmol/L): 6 mmol/L

## 2022-12-07 ENCOUNTER — Ambulatory Visit (HOSPITAL_COMMUNITY)
Admission: EM | Admit: 2022-12-07 | Discharge: 2022-12-07 | Disposition: A | Discharge status: Home / Self Care | Payer: Medicare Other | Attending: Family | Admitting: Family

## 2022-12-07 DIAGNOSIS — F4320 Adjustment disorder, unspecified: Secondary | ICD-10-CM | POA: Insufficient documentation

## 2022-12-07 DIAGNOSIS — F4329 Adjustment disorder with other symptoms: Secondary | ICD-10-CM | POA: Diagnosis not present

## 2022-12-07 NOTE — Progress Notes (Signed)
   12/07/22 1759  [place] (Walk-ins at [facility] only)  How Did You Hear About [place]? Other (Comment)  What Is the Reason for Your Visit/Call Today? Pt reports insomnia on and off for a while which has now turned into depression and anxiety. Pt receives counseling service with Steffanie Rainwater and she is going to [place] at [facility] for medication management. Pt reports she is taking '50mg'$  of  nortriptyline going on two weeks and '5mg'$  of lorazepam which was helping at one time. Pt reports  increased anxiety due to lack of sleep and at her husband request (who works for [employer]) she came to  make sure the medications she is on is the best choice for her. Pt reports her husband may be upset that she took two of her lorazepam the other day. Pt denies SI, HI, AVH and substance use.  How Long Has This Been Causing You Problems? > than 6 months  Have You Recently Had Any Thoughts About Hurting Yourself? No  Are You Planning to Commit Suicide/Harm Yourself At This time? No  Have you Recently Had Thoughts About [place]? No  Are You Planning To Harm Someone At This Time? No  Are you currently experiencing any auditory, visual or other hallucinations? No  Have You Used Any Alcohol or Drugs in the Past 24 Hours? No  Do you have any current medical co-morbidities that require immediate attention? No  Clinician description of patient physical appearance/behavior: calm  What Do You Feel Would Help You the Most Today? Treatment for Depression or other mood problem  If access to [facility] was not available, would you have sought care in the Emergency Department? No  Determination of Need Routine (7 days)  Options For Referral Medication Management;Outpatient Therapy

## 2022-12-07 NOTE — Discharge Instructions (Addendum)
Base on the information you have provided and the presenting issue, outpatient services and resources for have been recommended.  It is imperative that you follow through with treatment recommendations within 5-7 days from the of discharge to mitigate further risk to your safety and mental well-being. A list of referrals has been provided below to get you started.  You are not limited to the list provided.  In case of an urgent crisis, you may contact the Mobile Crisis Unit with [facility] at [phone].(518)[phone].  Mental Health Intensive Outpatient Therapy Program  The Intensive Outpatient Therapy Program (IOP) is short-term (ten day) group therapy program for adults (men and women) experiencing depression and/or anxiety.  Members of the program will learn that they are not alone as they receive support from others who are working on similar issues in an environment that promotes healing and growth.  Members will learn new ways to cope with stress, anxiety and depression through a combination of group therapy, mental health education and peer support. A variety of methods for mood stabilization will be used to help with the recovery and rebuilding process.  Reasons for participating in the program may include, but are not limited to:   Relationship and family problems   Depression after childbirth    Major life changes   Employment stress   Grief and Loss Issues   Group therapy sessions occur daily (Monday through Friday from 9:00 am to 12:00 pm) and are facilitated by a Licensed Therapist.  The groups are designed to be interactive and person centered to allow each member to be the center of their recovery process. Each member will personalize their own recovery experience by identifying and working towards their own personalized wellness goals.  In addition to group counseling, members will also receive a thorough medication evaluation and ongoing mediation monitoring by the  program licensed Psychiatrist, as well as supportive services and referrals to community resources by the programs [employer] Case Freight forwarder.  As members approach the end of their stay in the program, they will actively participate in creating their own personalized transition plan. Members will review their progress, discuss ways to maintain wellness and receive referrals to community resources that will assist with continued recovery.  We are committed to providing exceptional mental health services and are proud to be accredited nationally by both the [employer], holding the coveted [employer] and by the [facility], holding the [employer] in quality care, Designer, fashion/clothing.    If you would like more information on The Intensive Outpatient Therapy Program (IOP), or would like to make a referral please contact us. We are here to help and look forward to speaking with you.  Sincerely,   Dellia Nims, M.Ed, CNA             Patient is instructed prior to discharge to:  Take all medications as prescribed by his/her mental healthcare provider. Report any adverse effects and or reactions from the medicines to his/her outpatient provider promptly. Keep all scheduled appointments, to ensure that you are getting refills on time and to avoid any interruption in your medication.  If you are unable to keep an appointment call to reschedule.  Be sure to follow-up with resources and follow-up appointments provided.  Patient has been instructed & cautioned: To not engage in alcohol and or illegal drug use while on prescription medicines. In the event of worsening symptoms, patient is instructed to call the crisis hotline,  911 and or go to the nearest ED for appropriate evaluation and treatment of symptoms. To follow-up with his/her primary care provider for your other medical issues, concerns and or health care needs.  Information: -National Suicide  Prevention Lifeline 1-800-SUICIDE or 587 868 1416.  -988 offers 24/7 access to trained crisis counselors who can help people experiencing mental health-related distress. People can call or text 988 or chat 988lifeline.org for themselves or if they are worried about a loved one who may need crisis support.

## 2022-12-07 NOTE — ED Provider Notes (Signed)
Behavioral Health Urgent Care Medical Screening Exam  Patient Name: Emily Brandt MRN: [id number] Date of Evaluation: 12/07/22 Chief Complaint:   Diagnosis:  Final diagnoses:  Adjustment disorder with other symptom    History of Present illness: Emily Brandt is a 67 y.o. female. Patient presents voluntarily to [facility] behavioral health for walk-in assessment.  Patient is accompanied by her husband, Octavia Bruckner, who does not present during assessment.  Patient is assessed, face-to-face, by nurse practitioner. Emily Brandt is  seated in assessment area, no acute distress. Consulted with provider, Dr. Dwyane Dee, and chart reviewed on 12/07/2022. Emily Brandt  is alert and oriented, pleasant and cooperative during assessment.   Emily Brandt reports insomnia ongoing for 4 months.  Emily Brandt sleeps as little as 3 hours per night.  Treated for insomnia four years ago.  Insomnia 4 years ago triggered by patient's father was diagnosed with Alzheimer's dementia.  Stable on nortriptyline until December 2024, patient discontinued nortriptyline with concern for elevated heart rate.  Primary care provider manages medications.  Trazodone 50 mg initiated in January, increased to 100 mg, sleep did not improve.  Nortriptyline 25 mg again initiated 4 weeks ago with 14-day supply of lorazepam.  Patient reports nortriptyline with lorazepam effective.  Patient did take 2 lorazepam "a couple of nights" for sleep.  2 weeks ago nortriptyline increased to 50 mg, after discontinuation of lorazepam sleep again decreased.  Patient reports recent stressors include an abnormal mammogram in the fall 2023 resulting in additional testing.  Patient concerned at that time for breast cancer.  Ultimately no cancer diagnosed.  Patient reports "family did not support me in the way that I would like."  4 years ago patient's husband hospitalized for medical concerns.  Patient reports often thinks that Emily Brandt may "be alone."  Emily Brandt reports Emily Brandt is troubled by thoughts of  being alone.  Elka began following up with Steffanie Rainwater for individual counseling 5 weeks ago.  Meets with counselor weekly.  Emily Brandt would like resources for outpatient psychiatry to review medication management.  No history of inpatient psychiatric hospitalization.  No family mental health history reported.  Patient  presents with depressed mood, congruent affect. Emily Brandt  denies suicidal and homicidal ideations. Denies history of suicide attempts, denies history of self-harm.  Patient easily  contracts verbally for safety with this Probation officer. Patient has normal speech and behavior.  Emily Brandt  denies auditory and visual hallucinations.  Patient is able to converse coherently with goal-directed thoughts and no distractibility or preoccupation.  Denies symptoms of paranoia.  Objectively there is no evidence of psychosis/mania or delusional thinking.  Yocelin resides in [place] with her husband.  There are weapons in the home, patient's husband, Octavia Bruckner, agrees to secure all weapons later today.  Patient is retired.  Emily Brandt denies alcohol and substance use.  Patient endorses average appetite.  Patient offered support and encouragement.  Patient and husband verbalized agreement with treatment plan and understanding of strict return precautions.   Patient and family are educated and verbalize understanding of mental health resources and other crisis services in the community. They are instructed to call 911 and present to the nearest emergency room should patient experience any suicidal/homicidal ideation, auditory/visual/hallucinations, or detrimental worsening of mental health condition.      Flowsheet Row Admission (Discharged) from 08/29/2022 in [place] No Risk       Psychiatric Specialty Exam  Presentation  General Appearance:Appropriate for Environment; Casual  Eye Contact:Good  Speech:Clear and Coherent; Normal Rate  Speech Volume:Normal  Handedness:Right   Mood and  Affect  Mood: Depressed  Affect: Appropriate; Congruent   Thought Process  Thought Processes: Coherent; Goal Directed; Linear  Descriptions of Associations:Intact  Orientation:Full (Time, Place and Person)  Thought Content:Logical; WDL    Hallucinations:None  Ideas of Reference:None  Suicidal Thoughts:No  Homicidal Thoughts:No   Sensorium  Memory: Immediate Good; Recent Good  Judgment: Good  Insight: Good   Executive Functions  Concentration: Good  Attention Span: Good  Recall: Good  Fund of Knowledge: Good  Language: Good   Psychomotor Activity  Psychomotor Activity: Normal   Assets  Assets: Communication Skills; Desire for Improvement; Financial Resources/Insurance; Housing; Leisure Time; Physical Health; Resilience; Social Support   Sleep  Sleep: Poor  Number of hours:  3   Physical Exam: Physical Exam Vitals and nursing note reviewed.  Constitutional:      Appearance: Normal appearance. Emily Brandt is well-developed.  HENT:     Head: Normocephalic and atraumatic.     Nose: Nose normal.  Cardiovascular:     Rate and Rhythm: Normal rate.  Pulmonary:     Effort: Pulmonary effort is normal.  Musculoskeletal:        General: Normal range of motion.     Cervical back: Normal range of motion.  Skin:    General: Skin is warm and dry.  Neurological:     Mental Status: Emily Brandt is alert and oriented to person, place, and time.  Psychiatric:        Attention and Perception: Attention and perception normal.        Mood and Affect: Affect normal. Mood is depressed.        Speech: Speech normal.        Behavior: Behavior normal. Behavior is cooperative.        Thought Content: Thought content normal.        Cognition and Memory: Cognition and memory normal.        Judgment: Judgment normal.    Review of Systems  Constitutional: Negative.   HENT: Negative.    Eyes: Negative.   Respiratory: Negative.    Cardiovascular: Negative.    Gastrointestinal: Negative.   Genitourinary: Negative.   Musculoskeletal: Negative.   Skin: Negative.   Neurological: Negative.   Psychiatric/Behavioral:  Positive for depression. The patient has insomnia.    Blood pressure (!) 146/63, pulse 94, temperature 98.1 F (36.7 C), temperature source Oral, resp. rate 18, height '5\' 5"'$  (1.651 m), weight 158 lb (71.7 kg), SpO2 100 %. Body mass index is 26.29 kg/m.  Musculoskeletal: Strength & Muscle Tone: within normal limits Gait & Station: normal Patient leans: N/A   [place] MSE Discharge Disposition for Follow up and Recommendations: Based on my evaluation the patient does not appear to have an emergency medical condition and can be discharged with resources and follow up care in outpatient services for Medication Management and Individual Therapy Follow-up with outpatient psychiatry, resources provided.  Continue current medications including nortriptyline 50 mg. Consider melatonin 3 mg nightly as needed/sleep.  Referral has been initiated on your behalf for intensive outpatient program at [facility] behavioral health.  [place] health intensive outpatient team will reach out to you regarding admission assessment.   Lucky Rathke, FNP 12/07/2022, 7:34 PM

## 2022-12-08 ENCOUNTER — Telehealth (HOSPITAL_COMMUNITY): Payer: Self-pay | Admitting: Psychiatry

## 2022-12-13 ENCOUNTER — Ambulatory Visit (HOSPITAL_COMMUNITY): Payer: Medicare Other | Admitting: Psychiatry

## 2022-12-18 ENCOUNTER — Ambulatory Visit (HOSPITAL_COMMUNITY): Payer: Medicare Other

## 2022-12-19 ENCOUNTER — Ambulatory Visit (HOSPITAL_COMMUNITY): Payer: Medicare Other

## 2022-12-20 ENCOUNTER — Ambulatory Visit (HOSPITAL_COMMUNITY): Payer: Medicare Other

## 2022-12-21 ENCOUNTER — Ambulatory Visit (HOSPITAL_COMMUNITY): Payer: Medicare Other

## 2022-12-22 ENCOUNTER — Ambulatory Visit (HOSPITAL_COMMUNITY): Payer: Medicare Other

## 2022-12-25 ENCOUNTER — Ambulatory Visit (HOSPITAL_COMMUNITY): Payer: Medicare Other

## 2022-12-26 ENCOUNTER — Ambulatory Visit (HOSPITAL_COMMUNITY): Payer: Medicare Other

## 2022-12-27 ENCOUNTER — Ambulatory Visit (HOSPITAL_COMMUNITY): Payer: Medicare Other

## 2022-12-28 ENCOUNTER — Ambulatory Visit (HOSPITAL_COMMUNITY): Payer: Medicare Other

## 2022-12-29 ENCOUNTER — Ambulatory Visit (HOSPITAL_COMMUNITY): Payer: Medicare Other

## 2023-01-09 ENCOUNTER — Ambulatory Visit (HOSPITAL_COMMUNITY): Payer: Medicare Other | Admitting: Psychiatry

## 2023-06-11 ENCOUNTER — Other Ambulatory Visit: Payer: Self-pay | Admitting: Obstetrics and Gynecology

## 2023-06-11 DIAGNOSIS — Z1231 Encounter for screening mammogram for malignant neoplasm of breast: Secondary | ICD-10-CM

## 2023-06-25 ENCOUNTER — Other Ambulatory Visit: Payer: Self-pay | Admitting: Family Medicine

## 2023-06-25 DIAGNOSIS — Z1231 Encounter for screening mammogram for malignant neoplasm of breast: Secondary | ICD-10-CM

## 2023-06-27 ENCOUNTER — Ambulatory Visit
Admission: RE | Admit: 2023-06-27 | Discharge: 2023-06-27 | Disposition: A | Discharge status: Home / Self Care | Payer: Medicare Other | Source: Ambulatory Visit | Attending: Obstetrics and Gynecology | Admitting: Obstetrics and Gynecology

## 2023-06-27 DIAGNOSIS — Z1231 Encounter for screening mammogram for malignant neoplasm of breast: Secondary | ICD-10-CM

## 2023-09-26 ENCOUNTER — Encounter (HOSPITAL_BASED_OUTPATIENT_CLINIC_OR_DEPARTMENT_OTHER): Payer: Self-pay

## 2023-09-26 ENCOUNTER — Other Ambulatory Visit: Payer: Self-pay

## 2023-09-26 ENCOUNTER — Emergency Department (HOSPITAL_BASED_OUTPATIENT_CLINIC_OR_DEPARTMENT_OTHER)
Admission: EM | Admit: 2023-09-26 | Discharge: 2023-09-26 | Disposition: A | Discharge status: Home / Self Care | Payer: Medicare Other | Attending: Emergency Medicine | Admitting: Emergency Medicine

## 2023-09-26 DIAGNOSIS — H43391 Other vitreous opacities, right eye: Secondary | ICD-10-CM | POA: Diagnosis not present

## 2023-09-26 DIAGNOSIS — H538 Other visual disturbances: Secondary | ICD-10-CM | POA: Diagnosis present

## 2023-09-26 NOTE — ED Triage Notes (Signed)
Patient arrives ambulatory to ED with complaints of visual changes. Per patient, she has been having floaters in her eye since last week. She was seen at her Optometrist last week and had a full exam. Patient states that she is now seeing lines (streak of light) in her vision.

## 2023-09-26 NOTE — ED Notes (Signed)
ED Provider at bedside. 

## 2023-09-26 NOTE — ED Provider Notes (Signed)
[place] [facility] AT [facility] Provider Note   CSN: [id number] Arrival date & time: 09/26/23  1819     History  Chief Complaint  Patient presents with   Visual Field Change    Emily Brandt is a 67 y.o. female.  This is a 67 year old female presents emergency department due to visual changes.  Patient reports that she is not having floaters in her right eye over the last week or so.  She had an exam at her optometrist last week, that she said was normal.  She says that now she has begun to see "a flash "of light in the lateral peripheral right visual field.  Patient states that she does not have any decreased vision, wears corrective lenses.        Home Medications Prior to Admission medications   Medication Sig Start Date End Date Taking? Authorizing Provider  acyclovir (ZOVIRAX) 400 MG tablet Take 1 tablet (400 mg total) by mouth daily. Prophylaxis 11/14/22   Genia Del, MD  CALCIUM PO Take 4 tablets by mouth daily.     [provider]  hydrochlorothiazide (MICROZIDE) 12.5 MG capsule TAKE 1 CAPSULE BY MOUTH EVERY DAY IN THE MORNING 02/20/20   [provider]  Multiple Vitamin (MULTIVITAMIN) tablet Take 1 tablet by mouth daily.    [provider]  traZODone (DESYREL) 50 MG tablet  10/19/22   [provider]  VITAMIN D PO Take 5,000 Int'l Units by mouth. Takes 1 twice a week    [provider]      Allergies    Sulfa antibiotics    Review of Systems   Review of Systems  Physical Exam Updated Vital Signs BP (!) 173/75 (BP Location: Left Arm)   Pulse (!) 101   Temp 98.6 F (37 C) (Oral)   Resp 20   Ht 5\' 5"  (1.651 m)   Wt 72 kg   SpO2 100%   BMI 26.41 kg/m  Physical Exam Constitutional:      Appearance: Normal appearance.  Eyes:     General: No scleral icterus.       Right eye: No discharge.        Left eye: No discharge.     Extraocular Movements: Extraocular movements intact.      Conjunctiva/sclera: Conjunctivae normal.     Pupils: Pupils are equal, round, and reactive to light.     Comments: No conjunctival injection, clear sclera.  No reported tenderness.  Visual acuity normal.  Bedside ultrasound did not show obvious retinal detachment.  Neurological:     Mental Status: She is alert.     ED Results / Procedures / Treatments   Labs (all labs ordered are listed, but only abnormal results are displayed) Labs Reviewed - No data to display  EKG None  Radiology No results found.  Procedures Ultrasound ED Ocular  Date/Time: 09/26/2023 7:30 PM  Performed by: Arletha Pili, DO Authorized by: Arletha Pili, DO   PROCEDURE DETAILS:    Indications: visual change     Assessed:  Right eye   Right eye axial view: obtained     Right eye sagittal view: obtained     Images: archived     Limitations:  None RIGHT EYE FINDINGS:     no foreign body noted in right eye    right eye lens not dislodged    no right eye increased optic nerve sheath diameter    no evidence of retinal detachment of  the right eye     Medications Ordered in ED Medications - No data to display  ED Course/ Medical Decision Making/ A&P                                 Medical Decision Making Six 80-year-old female here today with 1 week of floaters, onset of flashers.  Differential diagnoses include vitreous hemorrhage, retinal detachment, partial retinal detachment.  Plan-patient with intact visual acuity.  Normal external eye exam.  Funduscopic exam limited, however did not see any signs consistent with a central retinal vein occlusion.  Symptoms not consistent with central retinal artery occlusion.  Bedside ultrasound did not show obvious retinal attachment.  Likely vitreous hemorrhage versus small partial retinal detachment.  Will arrange outpatient ophthalmology follow-up.  Reassessment 7:30 PM-patient's visual acuity 20/30 in the right eye, 20/20 in the left.  Spoke with  on-call ophthalmologist Dr. Sherryll Burger who says he will be able to see the patient between 8 and 12 on Friday.  Will discharge home.           Final Clinical Impression(s) / ED Diagnoses Final diagnoses:  Floaters in visual field, right    Rx / DC Orders ED Discharge Orders     None         Arletha Pili, DO 09/26/23 1931

## 2023-09-26 NOTE — Discharge Instructions (Addendum)
I was able to speak with an ophthalmologist in [place], they will be able to see you on Friday between the hours of 8 and 12.  Return to the emergency department if you develop increased difficulty with your vision, or suddenly lose vision, develop pain in your eye.

## 2023-11-07 LAB — HM COLONOSCOPY

## 2023-11-22 ENCOUNTER — Encounter: Payer: Medicare Other | Admitting: Obstetrics and Gynecology

## 2023-11-22 NOTE — Progress Notes (Signed)
 68 y.o. G69P0010 Married Caucasian female here for a breast and pelvic exam.    The patient is also followed for hx HSV and Rx for Acyclovir.  Takes 400 mg daily for prevention.  No outbreaks.   No vaginal spotting.   Has insomnia.   Retired. Did pet grooming.  Married for 45 years.  Active in church.  Does pilates.   PCP: Stamey, Verda Cumins, FNP   No LMP recorded. Patient is postmenopausal.           Sexually active: Yes.    The current method of family planning is post menopausal status.    Menopausal hormone therapy:  n/a Exercising: Yes.     pilates Smoker:  no  OB History     Gravida  1   Para      Term      Preterm      AB  1   Living  0      SAB  1   IAB      Ectopic      Multiple      Live Births              HEALTH MAINTENANCE: Last 2 paps: 10/11/20 neg, 07/30/17 neg History of abnormal Pap or positive HPV:  yes, many years ago Mammogram:  06/27/23 Breast Density Cat B, BI-RADS CAT 1 neg Colonoscopy: 11/07/23 - polyp - due in 2028.  Dr. Loreta Ave. Bone Density:  03/14/22  Result  WNL   Immunization History  Administered Date(s) Administered   Influenza Split 07/15/2012   Influenza-Unspecified 06/24/2015   PFIZER(Purple Top)SARS-COV-2 Vaccination 12/18/2019, 01/14/2020, 08/09/2020   Rabies, IM 10/09/2019, 10/12/2019, 10/16/2019, 10/23/2019   Tdap 07/22/2014, 10/09/2019      reports that she has never smoked. She has never used smokeless tobacco. She reports that she does not currently use alcohol. She reports that she does not use drugs.  Past Medical History:  Diagnosis Date   Abnormal finding on Pap smear    Cervicitis   Arthritis    Elevated LFTs    Endometrial polyp    Gallstones    GERD (gastroesophageal reflux disease)    Herpes progenitalis    HSV (herpes simplex virus) infection    genital   Hypertension    MVP (mitral valve prolapse)    antibiotic for dental procedures   Osteopenia 08/2017   T score -1.4 FRAX 7.7% /  0.7%    Past Surgical History:  Procedure Laterality Date   BIOPSY THYROID  07/2016   BREAST BIOPSY  08/28/2022   MM LT RADIOACTIVE SEED LOC MAMMO GUIDE 08/28/2022 [facility] MAMMOGRAPHY   CERVICAL FUSION     COLPOSCOPY     DILATION AND CURETTAGE OF UTERUS     HYSTEROSCOPY     LAPAROSCOPIC CHOLECYSTECTOMY  09/03/2012   NASAL SEPTUM SURGERY     NASAL SEPTUM SURGERY  1974   RADIOACTIVE SEED GUIDED EXCISIONAL BREAST BIOPSY Left 08/29/2022   Procedure: RADIOACTIVE SEED GUIDED EXCISIONAL LEFT BREAST BIOPSY;  Surgeon: Emelia Loron, MD;  Location: [facility];  Service: General;  Laterality: Left;   SHOULDER SURGERY     TONSILLECTOMY     TONSILLECTOMY  1965   UTERINE FIBROID SURGERY  2005    Current Outpatient Medications  Medication Sig Dispense Refill   acyclovir (ZOVIRAX) 400 MG tablet Take 1 tablet (400 mg total) by mouth daily. Prophylaxis 90 tablet 4   CALCIUM PO Take 4 tablets by mouth daily.  hydrochlorothiazide (MICROZIDE) 12.5 MG capsule TAKE 1 CAPSULE BY MOUTH EVERY DAY IN THE MORNING     LORazepam (ATIVAN) 1 MG tablet Take by mouth.     losartan (COZAAR) 100 MG tablet Take 100 mg by mouth daily.     Multiple Vitamin (MULTIVITAMIN) tablet Take 1 tablet by mouth daily.     nortriptyline (PAMELOR) 25 MG capsule Take 50 mg by mouth at bedtime.     VITAMIN D PO Take 5,000 Int'l Units by mouth. Takes 1 twice a week     No current facility-administered medications for this visit.    ALLERGIES: Sulfa antibiotics  Family History  Adopted: Yes  Problem Relation Age of Onset   Colon cancer Mother    Hypertension Mother    Osteoporosis Mother    Cancer Mother    Kidney failure Father    Heart disease Sister    Osteoporosis Sister    Diabetes Maternal Grandmother    Heart disease Maternal Grandfather    Esophageal cancer Neg Hx    Rectal cancer Neg Hx    Stomach cancer Neg Hx    Breast cancer Neg Hx     Review of Systems  All other systems reviewed  and are negative.   PHYSICAL EXAM:  BP 134/82 (BP Location: Left Arm, Patient Position: Sitting, Cuff Size: Small)   Pulse (!) 108   Ht 5\' 5"  (1.651 m)   Wt 167 lb (75.8 kg)   SpO2 99%   BMI 27.79 kg/m     General appearance: alert, cooperative and appears stated age Head: normocephalic, without obvious abnormality, atraumatic Neck: no adenopathy, supple, symmetrical, trachea midline and thyroid normal to inspection and palpation Lungs: clear to auscultation bilaterally Breasts: normal appearance, no masses or tenderness, No nipple retraction or dimpling, No nipple discharge or bleeding, No axillary adenopathy Heart: regular rate and rhythm Abdomen: soft, non-tender; no masses, no organomegaly Extremities: extremities normal, atraumatic, no cyanosis or edema Skin: skin color, texture, turgor normal. No rashes or lesions Lymph nodes: cervical, supraclavicular, and axillary nodes normal. Neurologic: grossly normal  Pelvic: External genitalia:  no lesions              No abnormal inguinal nodes palpated.              Urethra:  normal appearing urethra with no masses, tenderness or lesions              Bartholins and Skenes: normal                 Vagina: normal appearing vagina with normal color and discharge, no lesions              Cervix: no lesions              Pap taken: Yes.   Bimanual Exam:  Uterus:  normal size, contour, position, consistency, mobility, non-tender              Adnexa: no mass, fullness, tenderness              Rectal exam: Yes.  .  Confirms.              Anus:  normal sphincter tone, no lesions  Chaperone was present for exam:  Warren Lacy, CMA  ASSESSMENT: Encounter for breast and pelvic exam.  Cervical cancer screening.  HSV II.  Encounter for medication monitoring.  Hx prior osteopenia.  BMD 2023 normal.   PLAN: Mammogram screening discussed. Self breast awareness  reviewed. Pap and HRV collected:  yes Guidelines for Calcium, Vitamin D, regular  exercise program including cardiovascular and weight bearing exercise. Medication refills:  Acyclovir 400 mg daily for HSV prevention.  Labs with PCP.  BMD in 2028.  Follow up:  yearly and prn.    20 min  total time was spent for this patient encounter, including preparation, face-to-face counseling with the patient, coordination of care, and documentation of the encounter regarding HSV, Acyclovir, and osteopenia counseling, in addition to doing the breast and pelvic exam and cervical cancer screening.

## 2023-12-06 ENCOUNTER — Ambulatory Visit (INDEPENDENT_AMBULATORY_CARE_PROVIDER_SITE_OTHER): Payer: Medicare Other | Admitting: Obstetrics and Gynecology

## 2023-12-06 ENCOUNTER — Other Ambulatory Visit (HOSPITAL_COMMUNITY)
Admission: RE | Admit: 2023-12-06 | Discharge: 2023-12-06 | Disposition: A | Discharge status: Home / Self Care | Source: Ambulatory Visit | Attending: Obstetrics and Gynecology | Admitting: Obstetrics and Gynecology

## 2023-12-06 ENCOUNTER — Encounter: Payer: Self-pay | Admitting: Obstetrics and Gynecology

## 2023-12-06 VITALS — BP 134/82 | HR 108 | Ht 65.0 in | Wt 167.0 lb

## 2023-12-06 DIAGNOSIS — Z1151 Encounter for screening for human papillomavirus (HPV): Secondary | ICD-10-CM | POA: Diagnosis not present

## 2023-12-06 DIAGNOSIS — Z5181 Encounter for therapeutic drug level monitoring: Secondary | ICD-10-CM

## 2023-12-06 DIAGNOSIS — Z9189 Other specified personal risk factors, not elsewhere classified: Secondary | ICD-10-CM

## 2023-12-06 DIAGNOSIS — Z01419 Encounter for gynecological examination (general) (routine) without abnormal findings: Secondary | ICD-10-CM | POA: Insufficient documentation

## 2023-12-06 DIAGNOSIS — Z8739 Personal history of other diseases of the musculoskeletal system and connective tissue: Secondary | ICD-10-CM | POA: Diagnosis not present

## 2023-12-06 DIAGNOSIS — B009 Herpesviral infection, unspecified: Secondary | ICD-10-CM

## 2023-12-06 DIAGNOSIS — Z124 Encounter for screening for malignant neoplasm of cervix: Secondary | ICD-10-CM

## 2023-12-06 DIAGNOSIS — Z9289 Personal history of other medical treatment: Secondary | ICD-10-CM

## 2023-12-06 MED ORDER — ACYCLOVIR 400 MG PO TABS: 400.0000 mg | ORAL_TABLET | Freq: Every day | ORAL | 4 refills | Status: AC

## 2023-12-06 NOTE — Patient Instructions (Signed)

## 2023-12-10 ENCOUNTER — Encounter: Payer: Self-pay | Admitting: Obstetrics and Gynecology

## 2023-12-10 LAB — CYTOLOGY - PAP
Comment: NEGATIVE
Diagnosis: NEGATIVE
High risk HPV: NEGATIVE

## 2024-05-14 ENCOUNTER — Other Ambulatory Visit: Payer: Self-pay | Admitting: Obstetrics and Gynecology

## 2024-05-14 DIAGNOSIS — Z1231 Encounter for screening mammogram for malignant neoplasm of breast: Secondary | ICD-10-CM

## 2024-06-30 ENCOUNTER — Ambulatory Visit

## 2024-07-03 ENCOUNTER — Ambulatory Visit
Admission: RE | Admit: 2024-07-03 | Discharge: 2024-07-03 | Disposition: A | Source: Ambulatory Visit | Attending: Obstetrics and Gynecology | Admitting: Obstetrics and Gynecology

## 2024-07-03 DIAGNOSIS — Z1231 Encounter for screening mammogram for malignant neoplasm of breast: Secondary | ICD-10-CM

## 2024-07-08 ENCOUNTER — Other Ambulatory Visit: Payer: Self-pay | Admitting: Obstetrics and Gynecology

## 2024-07-08 ENCOUNTER — Ambulatory Visit: Payer: Self-pay | Admitting: Obstetrics and Gynecology

## 2024-07-08 DIAGNOSIS — R928 Other abnormal and inconclusive findings on diagnostic imaging of breast: Secondary | ICD-10-CM

## 2024-07-09 ENCOUNTER — Ambulatory Visit
Admission: RE | Admit: 2024-07-09 | Discharge: 2024-07-09 | Disposition: A | Discharge status: Home / Self Care | Source: Ambulatory Visit | Attending: Obstetrics and Gynecology | Admitting: Obstetrics and Gynecology

## 2024-07-09 ENCOUNTER — Ambulatory Visit: Payer: Self-pay | Admitting: Obstetrics and Gynecology

## 2024-07-09 DIAGNOSIS — R928 Other abnormal and inconclusive findings on diagnostic imaging of breast: Secondary | ICD-10-CM

## 2024-09-15 ENCOUNTER — Telehealth: Payer: Self-pay | Admitting: Obstetrics and Gynecology

## 2024-09-15 ENCOUNTER — Ambulatory Visit: Admitting: Obstetrics and Gynecology

## 2024-09-15 ENCOUNTER — Encounter: Payer: Self-pay | Admitting: Obstetrics and Gynecology

## 2024-09-15 VITALS — BP 126/82 | HR 82

## 2024-09-15 DIAGNOSIS — L723 Sebaceous cyst: Secondary | ICD-10-CM

## 2024-09-15 DIAGNOSIS — L089 Local infection of the skin and subcutaneous tissue, unspecified: Secondary | ICD-10-CM

## 2024-09-15 DIAGNOSIS — N764 Abscess of vulva: Secondary | ICD-10-CM | POA: Diagnosis not present

## 2024-09-15 MED ORDER — DOXYCYCLINE HYCLATE 100 MG PO CAPS: 100.0000 mg | ORAL_CAPSULE | Freq: Two times a day (BID) | ORAL | 0 refills | Status: AC

## 2024-09-15 NOTE — Patient Instructions (Addendum)
 Pocket of Fluid in the Skin (Epidermoid Cyst): What to Know  An epidermoid cyst is a small lump under your skin. The cyst contains a substance that is thick and oily. What are the causes? A blocked hair follicle. A hair curls and re-enters the skin instead of growing straight out of the skin. A blocked pore. Irritated skin. An injury to the skin. Certain conditions that are passed from parent to child. Human papillomavirus (HPV). This happens rarely when cysts occur on the bottom of the feet. Long-term sun damage to the skin. What increases the risk? Having acne. Being female. Having an injury to the skin. Being past puberty. Certain conditions that are passed down through family (genetic disorder). What are the signs or symptoms? The only sign of this type of cyst may be a small, painless lump under the skin. These cysts are usually painless, but they can get infected. Symptoms of infection may include: Redness. Inflammation. Tenderness. Warmth. Fever. A bad-smelling substance that drains from the cyst. Pus that drains from the cyst. How is this treated? If a cyst becomes inflamed, treatment may include: Opening and draining the cyst. Antibiotics. Shots of medicines called steroids that help lessen inflammation. Surgery to remove the cyst if it's large, painful, or could turn into cancer. Do not try to open or squeeze a cyst yourself. Follow these instructions at home: Medicines Take your medicines only as told. If you were given antibiotics, take them as told. Do not stop taking them even if you start to feel better. General instructions Keep the area around your cyst clean and dry. Wear loose, dry clothing. Avoid touching your cyst. Check your cyst every day for signs of infection. Check for: Redness, swelling, or pain. Fluid or blood. Warmth. Pus or a bad smell. Keep all follow-up visits to make sure there's no discomfort or infection. Contact a doctor if: You have  any signs of infection. Your cyst doesn't get better or gets worse. You get a cyst that looks different from other cysts you've had. You have a fever. You have redness that spreads from the cyst. This information is not intended to replace advice given to you by your health care provider. Make sure you discuss any questions you have with your health care provider. Document Revised: 05/04/2023 Document Reviewed: 05/04/2023 Elsevier Patient Education  2024 [employer]  Skin Abscess  A skin abscess is an infected area on or under your skin. It contains pus and other material. An abscess may also be called a furuncle, carbuncle, or boil. It is often the result of an infection caused by bacteria. An abscess can occur in or on almost any part of your body. Sometimes, an abscess may break open (rupture) on its own. In most cases, it will keep getting worse unless it is treated. An abscess can cause pain and make you feel ill. An untreated abscess can cause infection to spread to other parts of your body or your bloodstream. The abscess may need to be drained. You may also need to take antibiotics. What are the causes? An abscess occurs when germs, like bacteria, pass through your skin and cause an infection. This may be caused by: A scrape or cut on your skin. A puncture wound through your skin, such as a needle injection or insect bite. Blocked oil or sweat glands. Blocked and infected hair follicles. A fluid-filled sac that forms beneath your skin (sebaceous cyst) and becomes infected. What increases the risk? You may be more likely  to develop an abscess if: You have problems with blood circulation, or you have a weak body defense system (immune system). You have diabetes. You have dry and irritated skin. You get injections often or use IV drugs. You have a foreign body in a wound, such as a splinter. You smoke or use tobacco products. What are the signs or symptoms? Symptoms of this  condition include: A painful, firm bump under the skin. A bump with pus at the top. This may break through the skin and drain. Other symptoms include: Redness and swelling around the abscess. Warmth or tenderness. Swelling of the lymph nodes (glands) near the abscess. A sore on the skin. How is this diagnosed? This condition may be diagnosed based on a physical exam and your medical history. You may also have tests done, such as: A test of a sample of pus. This may be done to find what is causing the infection. Blood tests. Imaging tests, such as an ultrasound, CT scan, or MRI. How is this treated? A small abscess that drains on its own may not need to be treated. Treatment for larger abscesses may include: Moist heat or a heat pack applied to the area a few times a day. Incision and drainage. This is a procedure to drain the abscess. Antibiotics. For a severe abscess, you may first get antibiotics through an IV and then change to antibiotics by mouth. Follow these instructions at home: Medicines Take over-the-counter and prescription medicines only as told by your provider. If you were prescribed antibiotics, take them as told by your provider. Do not stop using the antibiotic even if you start to feel better. Abscess care  If you have an abscess that has not drained, apply heat to the affected area. Use the heat source that your provider recommends, such as a moist heat pack or a heating pad. Place a towel between your skin and the heat source. Leave the heat on for 20-30 minutes at a time. If your skin turns bright red, remove the heat right away to prevent burns. The risk of burns is higher if you cannot feel pain, heat, or cold. Follow instructions from your provider about how to take care of your abscess. Make sure you: Cover the abscess with a bandage (dressing). Wash your hands with soap and water for at least 20 seconds before and after you change the dressing or gauze. If soap  and water are not available, use hand sanitizer. Change your dressing or gauze as told by your provider. Check your abscess every day for signs of an infection that is getting worse. Check for: More redness, swelling, pain, or tenderness. More fluid or blood. Warmth. More pus or a worse smell. General instructions To avoid spreading the infection: Do not share personal care items, towels, or hot tubs with others. Avoid making skin contact with other people. Be careful when getting rid of used dressings, wound packing, or any drainage from the abscess. Do not use any products that contain nicotine or tobacco. These products include cigarettes, chewing tobacco, and vaping devices, such as e-cigarettes. If you need help quitting, ask your provider. Do not use any creams, ointments, or liquids unless you have been told to by your provider. Contact a health care provider if: You see redness that spreads quickly or red streaks on your skin spreading away from the abscess. You have any signs of worse infection at the abscess. You vomit every time you eat or drink. You have a fever,  chills, or muscle aches. The cyst or abscess returns. Get help right away if: You have severe pain. You make less pee (urine) than normal. This information is not intended to replace advice given to you by your health care provider. Make sure you discuss any questions you have with your health care provider. Document Revised: 05/03/2022 Document Reviewed: 05/03/2022 Elsevier Patient Education  2024 [employer].

## 2024-09-15 NOTE — Progress Notes (Signed)
 GYNECOLOGY  VISIT   HPI: 68 y.o.   Married  Caucasian female   G1P0010 with No LMP recorded. Patient is postmenopausal.   here for: Possible cyst or ingrown hair Left side.  Painful.  Noticed slight change in November but has gotten larger overnight. Yesterday noticed some brownish stain on underwear near her elastic band. Thinks it is trying to drain.   No fever, shakes, chills or feeling flu-like.  Hx HSV II. Taking antiviral medication daily.   GYNECOLOGIC HISTORY: No LMP recorded. Patient is postmenopausal. Contraception:  PMP Menopausal hormone therapy:  n/a Last 2 paps:  12/06/23 neg, HR HPV neg, 10/11/20 neg  History of abnormal Pap or positive HPV:  yes, many years ago Mammogram:  07/09/24 R Breast- Breast Density Cat B, BIRADS Cat 2 benign         OB History     Gravida  1   Para      Term      Preterm      AB  1   Living  0      SAB  1   IAB      Ectopic      Multiple      Live Births                 Patient Active Problem List   Diagnosis Date Noted   Primary osteoarthritis of both hands 05/15/2017   Primary osteoarthritis of both hips 05/15/2017   Primary osteoarthritis of both knees 05/15/2017   DDD (degenerative disc disease), cervical s/p cervical fusion 05/15/2017   DDD (degenerative disc disease), lumbar 05/15/2017   ANA positive has had no clinical features of autoimmune disease  05/15/2017   Osteopenia of multiple sites 05/15/2017   History of goiter 05/15/2017   Multinodular goiter 06/01/2016   Cavernous sinus thrombosis 10/24/2015   Preseptal cellulitis 10/24/2015   Abnormal finding on Pap smear    Herpes progenitalis    MVP (mitral valve prolapse)     Past Medical History:  Diagnosis Date   Abnormal finding on Pap smear    Cervicitis   Arthritis    Elevated LFTs    Endometrial polyp    Gallstones    GERD (gastroesophageal reflux disease)    Herpes progenitalis    HSV (herpes simplex virus) infection    genital    Hypertension    MVP (mitral valve prolapse)    antibiotic for dental procedures   Osteopenia 08/2017   T score -1.4 FRAX 7.7% / 0.7%    Past Surgical History:  Procedure Laterality Date   BIOPSY THYROID   07/2016   BREAST BIOPSY  08/28/2022   MM LT RADIOACTIVE SEED LOC MAMMO GUIDE 08/28/2022 [facility]MO[facility]   CERVICAL FUSION     COLPOSCOPY     DILATION AND CURETTAGE OF UTERUS     HYSTEROSCOPY     LAPAROSCOPIC CHOLECYSTECTOMY  09/03/2012   NASAL SEPTUM SURGERY     NASAL SEPTUM SURGERY  1974   RADIOACTIVE SEED GUIDED EXCISIONAL BREAST BIOPSY Left 08/29/2022   Procedure: RADIOACTIVE SEED GUIDED EXCISIONAL LEFT BREAST BIOPSY;  Surgeon: Ebbie Cough, MD;  Location: [facility];  Service: General;  Laterality: Left;   SHOULDER SURGERY     TONSILLECTOMY     TONSILLECTOMY  1965   UTERINE FIBROID SURGERY  2005    Current Outpatient Medications  Medication Sig Dispense Refill   acyclovir  (ZOVIRAX ) 400 MG tablet Take 1 tablet (400 mg total) by  mouth daily. Prophylaxis 90 tablet 4   CALCIUM PO Take 4 tablets by mouth daily.      hydrochlorothiazide (HYDRODIURIL) 25 MG tablet Take 25 mg by mouth every morning.     LORazepam  (ATIVAN ) 1 MG tablet Take by mouth.     losartan (COZAAR) 100 MG tablet Take 100 mg by mouth daily.     Multiple Vitamin (MULTIVITAMIN) tablet Take 1 tablet by mouth daily.     nortriptyline  (PAMELOR ) 25 MG capsule Take 50 mg by mouth at bedtime.     VITAMIN D  PO Take 5,000 Int'l Units by mouth. Takes 1 twice a week     No current facility-administered medications for this visit.     ALLERGIES: Sulfa antibiotics  Family History  Adopted: Yes  Problem Relation Age of Onset   Colon cancer Mother    Hypertension Mother    Osteoporosis Mother    Cancer Mother    Kidney failure Father    Heart disease Sister    Osteoporosis Sister    Diabetes Maternal Grandmother    Heart disease Maternal Grandfather    Esophageal cancer Neg Hx    Rectal  cancer Neg Hx    Stomach cancer Neg Hx    Breast cancer Neg Hx     Social History   Socioeconomic History   Marital status: Married    Spouse name: Not on file   Number of children: Not on file   Years of education: Not on file   Highest education level: Not on file  Occupational History   Not on file  Tobacco Use   Smoking status: Never   Smokeless tobacco: Never  Vaping Use   Vaping status: Never Used  Substance and Sexual Activity   Alcohol use: Not Currently    Comment: occas   Drug use: No   Sexual activity: Not Currently    Partners: Male    Birth control/protection: Post-menopausal    Comment: 1st intercourse- 18, partners- 1, hx of HSV, no abnormal pap, no DES  Other Topics Concern   Not on file  Social History Narrative   Not on file   Social Drivers of Health   Tobacco Use: Low Risk (09/15/2024)   Patient History    Smoking Tobacco Use: Never    Smokeless Tobacco Use: Never    Passive Exposure: Not on file  Financial Resource Strain: Not on file  Food Insecurity: Not on file  Transportation Needs: Not on file  Physical Activity: Not on file  Stress: Not on file  Social Connections: Not on file  Intimate Partner Violence: Not on file  Depression (EYV7-0): Not on file  Alcohol Screen: Not on file  Housing: Not on file  Utilities: Not on file  Health Literacy: Not on file    Review of Systems  All other systems reviewed and are negative.   PHYSICAL EXAMINATION:   BP 126/82 (BP Location: Left Arm, Patient Position: Sitting)   Pulse 82   SpO2 97%     General appearance: alert, cooperative and appears stated age   Pelvic: External genitalia:  left superior labia majora with 4 x 2.5 cm mobile, slightly tender mass with 2 mm draining area of left lateral side.              Urethra:  normal appearing urethra with no masses, tenderness or lesions              Bartholins and Skenes: normal  Vagina: normal appearing vagina with normal  color and discharge, no lesions              Cervix: no lesions                Bimanual Exam:  Uterus:  normal size, contour, position, consistency, mobility, non-tender              Adnexa: no mass, fullness, tenderness   I and D of left labia majora abscess.  Consent and time out done.  Hibiclens  prep.  Local 1% lidocaine , lot [id number], exp December 30, 2025.  Incision with scalpel.  Small amount of pus and sebum drained.  Wound culture performed.  Sterile gauze dressing placed.  No complications.  Minimal EBL.  Chaperone was present for exam:  Kari HERO, CMA  ASSESSMENT:  Left vulvar abscess.  Infected sebaceous cyst.   PLAN:  Start doxycycline  100 mg po bid x 7 days.  Bathe with soap and warm water.  Fu for a recheck in about 7 days.  Contact office if not improving.   20  min  total time was spent for this patient encounter, including preparation, face-to-face counseling with the patient, coordination of care, and documentation of the encounter in addition to draining the abscess.

## 2024-09-16 NOTE — Telephone Encounter (Signed)
 Opened in error. Encounter closed.

## 2024-09-18 LAB — CULTURE,AEROBIC BACTERIA WITH GRAM STAIN
MICRO NUMBER:: 17356061
RESULT:: NO GROWTH
SPECIMEN QUALITY:: ADEQUATE

## 2024-09-19 ENCOUNTER — Ambulatory Visit: Payer: Self-pay | Admitting: Obstetrics and Gynecology

## 2024-09-22 ENCOUNTER — Ambulatory Visit: Admitting: Obstetrics and Gynecology

## 2024-09-22 ENCOUNTER — Telehealth: Payer: Self-pay | Admitting: Obstetrics and Gynecology

## 2024-09-22 ENCOUNTER — Encounter: Payer: Self-pay | Admitting: Obstetrics and Gynecology

## 2024-09-22 VITALS — BP 114/64

## 2024-09-22 DIAGNOSIS — N907 Vulvar cyst: Secondary | ICD-10-CM

## 2024-09-22 MED ORDER — CEPHALEXIN 500 MG PO CAPS: 500.0000 mg | ORAL_CAPSULE | Freq: Two times a day (BID) | ORAL | 0 refills | Status: AC

## 2024-09-22 NOTE — Telephone Encounter (Signed)
 Spoke with Desiree at [facility]. Imaging updated to [facility].   Scheduled for 10/06/24 at 1045, arrive 1025.  [address] [place] Location.   Patient notified and is agreeable to date and time.   Routing to provider for final review. Patient is agreeable to disposition. Will close encounter.

## 2024-09-22 NOTE — Progress Notes (Signed)
 "  GYNECOLOGY  VISIT   HPI: 68 y.o.   Married  Caucasian female   G1P0010 with No LMP recorded. Patient is postmenopausal.   here for: Follow up -  Vulvar abscess. Tender but not as painful, took last pill this morning, thinks it may still be infected.    Did not experience drainage after the cyst was incised open at office visit on 09/15/24.   Wound cx showed:  normal skin flora.   GYNECOLOGIC HISTORY: No LMP recorded. Patient is postmenopausal. Contraception:  PMP Menopausal hormone therapy:  n/a Last 2 paps:  12/06/23 neg, HR HPV neg, 10/11/20 neg  History of abnormal Pap or positive HPV:  yes, many years ago Mammogram:  07/09/24 R Breast- Breast Density Cat B, BIRADS Cat 2 benign         OB History     Gravida  1   Para      Term      Preterm      AB  1   Living  0      SAB  1   IAB      Ectopic      Multiple      Live Births                 Patient Active Problem List   Diagnosis Date Noted   Primary osteoarthritis of both hands 05/15/2017   Primary osteoarthritis of both hips 05/15/2017   Primary osteoarthritis of both knees 05/15/2017   DDD (degenerative disc disease), cervical s/p cervical fusion 05/15/2017   DDD (degenerative disc disease), lumbar 05/15/2017   ANA positive has had no clinical features of autoimmune disease  05/15/2017   Osteopenia of multiple sites 05/15/2017   History of goiter 05/15/2017   Multinodular goiter 06/01/2016   Cavernous sinus thrombosis 10/24/2015   Preseptal cellulitis 10/24/2015   Abnormal finding on Pap smear    Herpes progenitalis    MVP (mitral valve prolapse)     Past Medical History:  Diagnosis Date   Abnormal finding on Pap smear    Cervicitis   Arthritis    Elevated LFTs    Endometrial polyp    Gallstones    GERD (gastroesophageal reflux disease)    Herpes progenitalis    HSV (herpes simplex virus) infection    genital   Hypertension    MVP (mitral valve prolapse)    antibiotic for dental  procedures   Osteopenia 08/2017   T score -1.4 FRAX 7.7% / 0.7%    Past Surgical History:  Procedure Laterality Date   BIOPSY THYROID   07/2016   BREAST BIOPSY  08/28/2022   MM LT RADIOACTIVE SEED LOC MAMMO GUIDE 08/28/2022 [facility]MO[facility]   CERVICAL FUSION     COLPOSCOPY     DILATION AND CURETTAGE OF UTERUS     HYSTEROSCOPY     LAPAROSCOPIC CHOLECYSTECTOMY  09/03/2012   NASAL SEPTUM SURGERY     NASAL SEPTUM SURGERY  1974   RADIOACTIVE SEED GUIDED EXCISIONAL BREAST BIOPSY Left 08/29/2022   Procedure: RADIOACTIVE SEED GUIDED EXCISIONAL LEFT BREAST BIOPSY;  Surgeon: Ebbie Cough, MD;  Location: [facility];  Service: General;  Laterality: Left;   SHOULDER SURGERY     TONSILLECTOMY     TONSILLECTOMY  1965   UTERINE FIBROID SURGERY  2005    Current Outpatient Medications  Medication Sig Dispense Refill   acyclovir  (ZOVIRAX ) 400 MG tablet Take 1 tablet (400 mg total) by mouth daily. Prophylaxis 90  tablet 4   CALCIUM PO Take 4 tablets by mouth daily.      cephALEXin  (KEFLEX ) 500 MG capsule Take 1 capsule (500 mg total) by mouth 2 (two) times daily. 14 capsule 0   doxycycline  (VIBRAMYCIN ) 100 MG capsule Take 1 capsule (100 mg total) by mouth 2 (two) times daily. Take twice daily for 7 days.  Take with food as can cause GI distress. 14 capsule 0   hydrochlorothiazide (HYDRODIURIL) 25 MG tablet Take 25 mg by mouth every morning.     LORazepam  (ATIVAN ) 1 MG tablet Take by mouth.     losartan (COZAAR) 100 MG tablet Take 100 mg by mouth daily.     Multiple Vitamin (MULTIVITAMIN) tablet Take 1 tablet by mouth daily.     nortriptyline  (PAMELOR ) 25 MG capsule Take 50 mg by mouth at bedtime.     VITAMIN D  PO Take 5,000 Int'l Units by mouth. Takes 1 twice a week     No current facility-administered medications for this visit.     ALLERGIES: Sulfa antibiotics  Family History  Adopted: Yes  Problem Relation Age of Onset   Colon cancer Mother    Hypertension Mother     Osteoporosis Mother    Cancer Mother    Kidney failure Father    Heart disease Sister    Osteoporosis Sister    Diabetes Maternal Grandmother    Heart disease Maternal Grandfather    Esophageal cancer Neg Hx    Rectal cancer Neg Hx    Stomach cancer Neg Hx    Breast cancer Neg Hx     Social History   Socioeconomic History   Marital status: Married    Spouse name: Not on file   Number of children: Not on file   Years of education: Not on file   Highest education level: Not on file  Occupational History   Not on file  Tobacco Use   Smoking status: Never   Smokeless tobacco: Never  Vaping Use   Vaping status: Never Used  Substance and Sexual Activity   Alcohol use: Not Currently    Comment: occas   Drug use: No   Sexual activity: Not Currently    Partners: Male    Birth control/protection: Post-menopausal    Comment: 1st intercourse- 18, partners- 1, hx of HSV, no abnormal pap, no DES  Other Topics Concern   Not on file  Social History Narrative   Not on file   Social Drivers of Health   Tobacco Use: Low Risk (09/22/2024)   Patient History    Smoking Tobacco Use: Never    Smokeless Tobacco Use: Never    Passive Exposure: Not on file  Financial Resource Strain: Not on file  Food Insecurity: Not on file  Transportation Needs: Not on file  Physical Activity: Not on file  Stress: Not on file  Social Connections: Not on file  Intimate Partner Violence: Not on file  Depression (EYV7-0): Not on file  Alcohol Screen: Not on file  Housing: Not on file  Utilities: Not on file  Health Literacy: Not on file    Review of Systems  All other systems reviewed and are negative.   PHYSICAL EXAMINATION:   BP 114/64 (BP Location: Left Arm, Patient Position: Sitting)     General appearance: alert, cooperative and appears stated age    Pelvic: External genitalia:  superior left labia majora with 3 x 2.5 cm mobile mass, mild erythema of the skin.   Chaperone was  present  for exam:  Dereck BROCKS, CMA  ASSESSMENT:  Left vulvar cyst.   Status post I and D of abscess formation.  I suspect there is still some cellulitis of the skin in the area.   PLAN:  Keflex  500 gm po bid x 7 days.  Will get a pelvic ultrasound at [facility] done to look at the labia in about 2 weeks in order to define the area further. Final plan to follow after imagine is done.        "

## 2024-09-22 NOTE — Telephone Encounter (Signed)
 Please assist in scheduling a pelvic US  at [facility] for a left labial cyst versus mass.   This needs to be done in 2 weeks.

## 2024-10-05 ENCOUNTER — Encounter: Payer: Self-pay | Admitting: Obstetrics and Gynecology

## 2024-10-06 ENCOUNTER — Ambulatory Visit
Admission: RE | Admit: 2024-10-06 | Discharge: 2024-10-06 | Disposition: A | Discharge status: Home / Self Care | Source: Ambulatory Visit | Attending: Obstetrics and Gynecology | Admitting: Obstetrics and Gynecology

## 2024-10-06 DIAGNOSIS — N907 Vulvar cyst: Secondary | ICD-10-CM

## 2024-10-14 ENCOUNTER — Ambulatory Visit: Payer: Self-pay | Admitting: Obstetrics and Gynecology

## 2024-12-11 ENCOUNTER — Encounter: Admitting: Obstetrics and Gynecology
